# Patient Record
Sex: Male | Born: 1970 | Race: White | Hispanic: No | Marital: Single | Smoking: Never smoker
Health system: Southern US, Community
[De-identification: ages and names within clinical notes are randomized; demographics above are authoritative.]

## PROBLEM LIST (undated history)

## (undated) DIAGNOSIS — I10 Essential (primary) hypertension: Secondary | ICD-10-CM

## (undated) DIAGNOSIS — J40 Bronchitis, not specified as acute or chronic: Secondary | ICD-10-CM

## (undated) DIAGNOSIS — J189 Pneumonia, unspecified organism: Secondary | ICD-10-CM

## (undated) HISTORY — PX: HAND SURGERY: SHX662

## (undated) HISTORY — PX: HERNIA REPAIR: SHX51

---

## 2001-06-06 ENCOUNTER — Encounter: Payer: Self-pay | Admitting: *Deleted

## 2001-06-06 ENCOUNTER — Encounter: Admission: RE | Admit: 2001-06-06 | Discharge: 2001-06-06 | Payer: Self-pay | Admitting: *Deleted

## 2001-06-23 ENCOUNTER — Ambulatory Visit (HOSPITAL_BASED_OUTPATIENT_CLINIC_OR_DEPARTMENT_OTHER): Admission: RE | Admit: 2001-06-23 | Discharge: 2001-06-23 | Payer: Self-pay | Admitting: *Deleted

## 2012-07-25 ENCOUNTER — Emergency Department (HOSPITAL_COMMUNITY)
Admission: EM | Admit: 2012-07-25 | Discharge: 2012-07-25 | Disposition: A | Discharge status: Home / Self Care | Payer: Self-pay | Attending: Emergency Medicine | Admitting: Emergency Medicine

## 2012-07-25 ENCOUNTER — Emergency Department (HOSPITAL_COMMUNITY): Payer: Self-pay

## 2012-07-25 ENCOUNTER — Encounter (HOSPITAL_COMMUNITY): Payer: Self-pay | Admitting: Emergency Medicine

## 2012-07-25 DIAGNOSIS — M25519 Pain in unspecified shoulder: Secondary | ICD-10-CM | POA: Insufficient documentation

## 2012-07-25 DIAGNOSIS — S46919A Strain of unspecified muscle, fascia and tendon at shoulder and upper arm level, unspecified arm, initial encounter: Secondary | ICD-10-CM

## 2012-07-25 MED ORDER — HYDROCODONE-ACETAMINOPHEN 5-325 MG PO TABS: 1.0000 | ORAL_TABLET | ORAL | Status: AC | PRN

## 2012-07-25 MED ORDER — IBUPROFEN 800 MG PO TABS
800.0000 mg | ORAL_TABLET | Freq: Once | ORAL | Status: AC
  Administered 2012-07-25: 800 mg via ORAL
  Filled 2012-07-25: qty 1

## 2012-07-25 MED ORDER — IBUPROFEN 800 MG PO TABS: 800.0000 mg | ORAL_TABLET | Freq: Three times a day (TID) | ORAL | Status: AC

## 2012-07-25 NOTE — ED Provider Notes (Signed)
History     CSN: [id number]  Arrival date & time 07/25/12  1032   First MD Initiated Contact with Patient 07/25/12 1137      Chief Complaint  Patient presents with  . Shoulder Pain    (Consider location/radiation/quality/duration/timing/severity/associated sxs/prior treatment) Patient is a 41 y.o. male presenting with shoulder pain. The history is provided by the patient.  Shoulder Pain This is a recurrent problem. The current episode started more than 1 month ago. The problem has been unchanged. Pertinent negatives include no chills or fever. Associated symptoms comments: Recurrent left shoulder pain without known injury x 2 months, worse today.Marland Kitchen    History reviewed. No pertinent past medical history.  Past Surgical History  Procedure Date  . Hernia repair   . Hand surgery     No family history on file.  History  Substance Use Topics  . Smoking status: Never Smoker   . Smokeless tobacco: Not on file  . Alcohol Use: Yes     weekends      Review of Systems  Constitutional: Negative for fever and chills.  HENT: Negative.   Respiratory: Negative.   Cardiovascular: Negative.   Musculoskeletal: Negative.        See HPI  Skin: Negative.   Neurological: Negative.     Allergies  Review of patient's allergies indicates no known allergies.  Home Medications   Current Outpatient Rx  Name Route Sig Dispense Refill  . ASPIRIN-ACETAMINOPHEN-CAFFEINE 250-250-65 MG PO TABS Oral Take 1 tablet by mouth every 6 (six) hours as needed. For headache.      Pulse 85  Temp 98.3 F (36.8 C) (Oral)  Resp 20  SpO2 98%  Physical Exam  Constitutional: He is oriented to person, place, and time. He appears well-developed and well-nourished.  Neck: Normal range of motion.  Pulmonary/Chest: Effort normal. He exhibits no tenderness.  Musculoskeletal: Normal range of motion. He exhibits no edema.       Left shoulder tenderness over AC joint without swelling. FROM, greatest pain on  abduction. Distal pulses are 2+, 5/5 grip strength.  Neurological: He is alert and oriented to person, place, and time.  Skin: Skin is warm and dry.  Psychiatric: He has a normal mood and affect.    ED Course  Procedures (including critical care time)  Labs Reviewed - No data to display Dg Shoulder Left  07/25/2012  *RADIOLOGY REPORT*  Clinical Data: Left shoulder pain  LEFT SHOULDER - 2+ VIEW  Comparison: None.  Findings: No evidence for fracture.  No findings to suggest shoulder separation or dislocation. No worrisome lytic or sclerotic osseous lesion.  IMPRESSION: Normal exam.  Original Report Authenticated By: ERIC A. MANSELL, M.D.     No diagnosis found. 1. Muscular shoulder pain   MDM  Negative x-ray done by nursing protocol. Supports muscular diagnosis.        Rodena Medin, PA-C 07/25/12 1237

## 2012-07-25 NOTE — ED Notes (Signed)
Pt presenting to ed with c/o left shoulder pain x couple of months that getting worse. Pt states intermittent numbness at night when sleeping. Pt states it hurts to move it. Pt denies injury at this time. Pt denies chest pain, nausea and vomiting at this time

## 2012-07-25 NOTE — ED Provider Notes (Signed)
Medical screening examination/treatment/procedure(s) were performed by non-physician practitioner and as supervising physician I was immediately available for consultation/collaboration.  Flint Melter, MD 07/25/12 1622

## 2013-08-02 ENCOUNTER — Emergency Department (HOSPITAL_COMMUNITY): Payer: BC Managed Care – PPO

## 2013-08-02 ENCOUNTER — Emergency Department (HOSPITAL_COMMUNITY)
Admission: EM | Admit: 2013-08-02 | Discharge: 2013-08-02 | Disposition: A | Discharge status: Home / Self Care | Payer: BC Managed Care – PPO | Attending: Emergency Medicine | Admitting: Emergency Medicine

## 2013-08-02 ENCOUNTER — Encounter (HOSPITAL_COMMUNITY): Payer: Self-pay | Admitting: Emergency Medicine

## 2013-08-02 DIAGNOSIS — Y93H1 Activity, digging, shoveling and raking: Secondary | ICD-10-CM | POA: Insufficient documentation

## 2013-08-02 DIAGNOSIS — M79602 Pain in left arm: Secondary | ICD-10-CM

## 2013-08-02 DIAGNOSIS — X503XXA Overexertion from repetitive movements, initial encounter: Secondary | ICD-10-CM | POA: Insufficient documentation

## 2013-08-02 DIAGNOSIS — Y929 Unspecified place or not applicable: Secondary | ICD-10-CM | POA: Insufficient documentation

## 2013-08-02 DIAGNOSIS — M25529 Pain in unspecified elbow: Secondary | ICD-10-CM | POA: Insufficient documentation

## 2013-08-02 DIAGNOSIS — R079 Chest pain, unspecified: Secondary | ICD-10-CM

## 2013-08-02 DIAGNOSIS — R0789 Other chest pain: Secondary | ICD-10-CM | POA: Insufficient documentation

## 2013-08-02 LAB — BASIC METABOLIC PANEL
Calcium: 9.1 mg/dL (ref 8.4–10.5)
GFR calc Af Amer: >90 mL/min (ref >90)
GFR calc non Af Amer: >90 mL/min (ref >90)
Glucose, Bld: 152 mg/dL — ABNORMAL HIGH (ref 70–99)
Potassium: 3.8 mEq/L (ref 3.5–5.1)
Sodium: 137 mEq/L (ref 135–145)

## 2013-08-02 LAB — CBC
Hemoglobin: 17.2 g/dL — ABNORMAL HIGH (ref 13.0–17.0)
MCHC: 36.4 g/dL — ABNORMAL HIGH (ref 30.0–36.0)
Platelets: 202 10*3/uL (ref 150–400)
RDW: 12.5 % (ref 11.5–15.5)

## 2013-08-02 LAB — POCT I-STAT TROPONIN I: Troponin i, poc: 0 ng/mL (ref 0.00–0.08)

## 2013-08-02 MED ORDER — MORPHINE SULFATE 4 MG/ML IJ SOLN
4.0000 mg | Freq: Once | INTRAMUSCULAR | Status: AC
  Administered 2013-08-02: 4 mg via INTRAVENOUS
  Filled 2013-08-02 (×2): qty 1

## 2013-08-02 MED ORDER — MORPHINE SULFATE 4 MG/ML IJ SOLN
4.0000 mg | Freq: Once | INTRAMUSCULAR | Status: AC
  Administered 2013-08-02: 4 mg via INTRAVENOUS

## 2013-08-02 MED ORDER — HYDROCODONE-ACETAMINOPHEN 5-325 MG PO TABS: 1.0000 | ORAL_TABLET | ORAL | Status: DC | PRN

## 2013-08-02 NOTE — ED Notes (Signed)
Pt c/o left sided CP that radiates to left arm. Pt seen by PMD and had a cortisone shot in left elbow and told pt he would send him for a stress test if pain did not get better. Pt reports diaphoresis.

## 2013-08-02 NOTE — ED Provider Notes (Signed)
CSN: [id number]     Arrival date & time 08/02/13  1521 History     None    Chief Complaint  Patient presents with  . Chest Pain   (Consider location/radiation/quality/duration/timing/severity/associated sxs/prior Treatment) HPI 42 year old male with a significant medical history presents with left arm pain radiating to his chest. 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  Report describes as a shooting pain that begins in his hand and radiates left-sided chest and is severe. No associated shortness of breath, nausea, diaphoresis, lightheadedness, palpitations. Reports as been present for one week and use his primary care physician regarding this pain to get a steroid injection into his elbow, however he has continued to have pain. Pain is not pleuritic, not positional, and not worsened by exertion. Reports his primary care physician suggested that he continue to have pain that they would organize a stress test for him on Monday.  Denies any numbness or weakness or neck pain.  Has had a normal stress test and reports that was over 2 years ago.  Denies any history of DVT/PE, family history DVT/PE, recent surgery or immobilization, no cancer history, hemoptysis, asymmetric leg pain or swelling. Denies any prior cardiac history and denies history of smoking.   History reviewed. No pertinent past medical history. Past Surgical History  Procedure Laterality Date  . Hernia repair    . Hand surgery     No family history on file. History  Substance Use Topics  . Smoking status: Never Smoker   . Smokeless tobacco: Not on file  . Alcohol Use: Yes     Comment: weekends    Review of Systems  Constitutional: Negative for fever.  HENT: Negative for sore throat and neck stiffness.   Eyes: Negative for visual disturbance.  Respiratory:  Negative for shortness of breath (after walking .25 miles).   Cardiovascular: Positive for chest pain. Negative for leg swelling.  Gastrointestinal: Negative for nausea, vomiting and abdominal pain.  Genitourinary: Negative for difficulty urinating.  Musculoskeletal: Positive for arthralgias (arm pain). Negative for back pain.  Skin: Negative for rash.  Neurological: Negative for syncope and headaches.    Allergies  Review of patient's allergies indicates no known allergies.  Home Medications   Current Outpatient Rx  Name  Route  Sig  Dispense  Refill  . acetaminophen (TYLENOL) 325 MG tablet   Oral   Take 650 mg by mouth every 6 (six) hours as needed for pain.         Marland Kitchen ibuprofen (ADVIL,MOTRIN) 200 MG tablet   Oral   Take 400 mg by mouth every 6 (six) hours as needed for pain.         Marland Kitchen HYDROcodone-acetaminophen (NORCO/VICODIN) 5-325 MG per tablet   Oral   Take 1-2 tablets by mouth every 4 (four) hours as needed for pain.   12 tablet   0    BP 134/91  Pulse 83  Temp(Src) 97.6 F (36.4 C) (Oral)  Resp 17  Ht 5\' 5"  (1.651 m)  Wt 206 lb (93.441 kg)  BMI 34.28 kg/m2  SpO2 98% Physical Exam  Nursing note and vitals reviewed. Constitutional: He is oriented to person, place, and time. He appears well-developed and well-nourished. No distress.  HENT:  Head: Normocephalic and atraumatic.  Eyes: Conjunctivae and EOM are normal.  Neck: Normal range  of motion.  Cardiovascular: Normal rate, regular rhythm, normal heart sounds and intact distal pulses.  Exam reveals no gallop and no friction rub.   No murmur heard. Pulmonary/Chest: Effort normal and breath sounds normal. No respiratory distress. He has no wheezes. He has no rales. He exhibits no tenderness.  Abdominal: Soft. He exhibits no distension. There is no tenderness. There is no guarding.  Musculoskeletal: He exhibits no edema and no tenderness.  No neck, shoulder, arm tenderness. FROM. Normal strength and sensation  left arm  Neurological: He is alert and oriented to person, place, and time.  Skin: Skin is warm and dry. He is not diaphoretic.    ED Course   Procedures (including critical care time)  Labs Reviewed  CBC - Abnormal; Notable for the following:    Hemoglobin 17.2 (*)    MCHC 36.4 (*)    All other components within normal limits  BASIC METABOLIC PANEL - Abnormal; Notable for the following:    Glucose, Bld 152 (*)    All other components within normal limits  POCT I-STAT TROPONIN I   Dg Chest 2 View (if Patient Has Fever And/or Copd)  08/02/2013   *RADIOLOGY REPORT*  Clinical Data: Chest pain  CHEST - 2 VIEW  Comparison: December 10, 2004  Findings: Lungs clear.  Heart size and pulmonary vascularity are normal.  No adenopathy.  There is mild degenerative change in the thoracic spine. No pneumothorax.  IMPRESSION: No edema or consolidation.   Original Report Authenticated By: Bretta Bang, M.D.   Date: 08/02/2013  Rate: 74  Rhythm: normal sinus rhythm  QRS Axis: normal  Intervals: normal  ST/T Wave abnormalities: normal  Conduction Disutrbances:none  Narrative Interpretation:   Old EKG Reviewed: none available   1. Arm pain, left   2. Chest pain     MDM  42 year old male with a significant medical history presents with left arm pain radiating to his chest for 1 week.  Differential diagnosis includes aortic dissection, pneumonia, pneumothorax, ACS, PE.  EKG evaluated by me and shows no acute ischemic changes and no signs of pericarditis. Chest x-ray shows no sign of heart failure, pneumothorax, dissection, or pneumonia.  Initial troponin negative, and given patient's symptoms have been present and constant for one week had low suspicion given a negative troponin the patient has ACS.  Patient is perked negative, and a low suspicion for PE and symptoms, no tachycardia, no shortness of breath, no hypoxia, no risk factors.  Patient with normal arm strength and sensation, and no  midline neck tenderness, no history of trauma to the neck and do not suspect acute cord compression causing symptoms.   Patient given morphine for pain with relief.  Likely musculoskeletal symptoms given relation of physical work day before symptoms. Discussed results with patient and recommended close PCP followup, including the stress test that he had discussed with his PCP previously.  Patient discharged in stable condition with understanding of reasons to return.   Rhae Lerner, MD 08/03/13 1441  Rhae Lerner, MD 08/03/13 [phone]

## 2013-08-03 NOTE — ED Provider Notes (Signed)
I saw and evaluated the patient, reviewed the resident's note and I agree with the findings and plan.   Signs/Sx c/w MSK etiology of arm pain radiating to chest. Has PCP f/u tomorrow to set up outpatient stress.   [name], MD 08/03/13 [phone]

## 2014-06-21 ENCOUNTER — Encounter (HOSPITAL_BASED_OUTPATIENT_CLINIC_OR_DEPARTMENT_OTHER): Payer: Self-pay | Admitting: Emergency Medicine

## 2014-06-21 ENCOUNTER — Emergency Department (HOSPITAL_BASED_OUTPATIENT_CLINIC_OR_DEPARTMENT_OTHER)
Admission: EM | Admit: 2014-06-21 | Discharge: 2014-06-22 | Disposition: A | Discharge status: Home / Self Care | Payer: 59 | Attending: Emergency Medicine | Admitting: Emergency Medicine

## 2014-06-21 DIAGNOSIS — L089 Local infection of the skin and subcutaneous tissue, unspecified: Secondary | ICD-10-CM | POA: Insufficient documentation

## 2014-06-21 DIAGNOSIS — Z79899 Other long term (current) drug therapy: Secondary | ICD-10-CM | POA: Insufficient documentation

## 2014-06-21 NOTE — ED Notes (Signed)
Pt with paronychia to left pointer finger, swelling and redness.

## 2014-06-22 MED ORDER — HYDROCODONE-ACETAMINOPHEN 5-325 MG PO TABS: 2.0000 | ORAL_TABLET | ORAL | Status: DC | PRN

## 2014-06-22 MED ORDER — CEPHALEXIN 500 MG PO CAPS: 500.0000 mg | ORAL_CAPSULE | Freq: Four times a day (QID) | ORAL | Status: DC

## 2014-06-22 MED ORDER — SULFAMETHOXAZOLE-TRIMETHOPRIM 800-160 MG PO TABS: 1.0000 | ORAL_TABLET | Freq: Two times a day (BID) | ORAL | Status: AC

## 2014-06-22 NOTE — ED Provider Notes (Signed)
Medical screening examination/treatment/procedure(s) were performed by non-physician practitioner and as supervising physician I was immediately available for consultation/collaboration.   EKG Interpretation None       Doug SouSam Aryelle Figg, MD 06/22/14 0040

## 2014-06-22 NOTE — ED Provider Notes (Signed)
CSN: [id number]     Arrival date & time 06/21/14  2141 History   First MD Initiated Contact with Patient 06/22/14 0013     Chief Complaint  Patient presents with  . Finger Injury     (Consider location/radiation/quality/duration/timing/severity/associated sxs/prior Treatment) Patient is a 43 y.o. male presenting with hand pain. The history is provided by the patient. No language interpreter was used.  Hand Pain This is a new problem. The current episode started in the past 7 days. The problem occurs constantly. The problem has been gradually improving. Pertinent negatives include no vomiting. Nothing aggravates the symptoms. He has tried nothing for the symptoms. The treatment provided no relief.   Pt complains of swelling and pain to end of finger History reviewed. No pertinent past medical history. Past Surgical History  Procedure Laterality Date  . Hernia repair    . Hand surgery     No family history on file. History  Substance Use Topics  . Smoking status: Never Smoker   . Smokeless tobacco: Not on file  . Alcohol Use: Yes     Comment: occ    Review of Systems  Gastrointestinal: Negative for vomiting.  All other systems reviewed and are negative.     Allergies  Review of patient's allergies indicates no known allergies.  Home Medications   Prior to Admission medications   Medication Sig Start Date End Date Taking? Authorizing Provider  acetaminophen (TYLENOL) 325 MG tablet Take 650 mg by mouth every 6 (six) hours as needed for pain.    Historical Provider, MD  cephALEXin (KEFLEX) 500 MG capsule Take 1 capsule (500 mg total) by mouth 4 (four) times daily. 06/22/14   Elson AreasLeslie K Sofia, PA-C  HYDROcodone-acetaminophen (NORCO/VICODIN) 5-325 MG per tablet Take 1-2 tablets by mouth every 4 (four) hours as needed for pain. 08/02/13   Rhae LernerErin Elizabeth Schlossman, MD  HYDROcodone-acetaminophen (NORCO/VICODIN) 5-325 MG per tablet Take 2 tablets by mouth every 4 (four) hours as  needed. 06/22/14   Elson AreasLeslie K Sofia, PA-C  ibuprofen (ADVIL,MOTRIN) 200 MG tablet Take 400 mg by mouth every 6 (six) hours as needed for pain.    Historical Provider, MD  sulfamethoxazole-trimethoprim (BACTRIM DS,SEPTRA DS) 800-160 MG per tablet Take 1 tablet by mouth 2 (two) times daily. 06/22/14 06/29/14  Elson AreasLeslie K Sofia, PA-C   BP 122/83  Pulse 82  Temp(Src) 98.7 F (37.1 C) (Oral)  Resp 18  Wt 202 lb 1.6 oz (91.672 kg)  SpO2 98% Physical Exam  Nursing note and vitals reviewed. Constitutional: He appears well-developed and well-nourished.  Musculoskeletal: He exhibits tenderness.  Swelling and redness to end of finger tip.  No fluctuance  Neurological: He is alert.  Skin: Skin is warm.    ED Course  Procedures (including critical care time) Labs Review Labs Reviewed - No data to display  Imaging Review No results found.   EKG Interpretation None      MDM   Final diagnoses:  Finger infection    Bactrim hydrocodone    Elson AreasLeslie K Sofia, PA-C 06/22/14 0037

## 2014-06-22 NOTE — Discharge Instructions (Signed)
Fingertip Infection °When an infection is around the nail, it is called a paronychia. When it appears over the tip of the finger, it is called a felon. These infections are due to minor injuries or cracks in the skin. If they are not treated properly, they can lead to bone infection and permanent damage to the fingernail. °Incision and drainage is necessary if a pus pocket (an abscess) has formed. Antibiotics and pain medicine may also be needed. Keep your hand elevated for the next 2-3 days to reduce swelling and pain. If a pack was placed in the abscess, it should be removed in 1-2 days by your caregiver. Soak the finger in warm water for 20 minutes 4 times daily to help promote drainage. °Keep the hands as dry as possible. Wear protective gloves with cotton liners. See your caregiver for follow-up care as recommended.  °HOME CARE INSTRUCTIONS  °· Keep wound clean, dry and dressed as suggested by your caregiver. °· Soak in warm salt water for fifteen minutes, four times per day for bacterial infections. °· Your caregiver will prescribe an antibiotic if a bacterial infection is suspected. Take antibiotics as directed and finish the prescription, even if the problem appears to be improving before the medicine is gone. °· Only take over-the-counter or prescription medicines for pain, discomfort, or fever as directed by your caregiver. °SEEK IMMEDIATE MEDICAL CARE IF: °· There is redness, swelling, or increasing pain in the wound. °· Pus or any other unusual drainage is coming from the wound. °· An unexplained oral temperature above 102° F (38.9° C) develops. °· You notice a foul smell coming from the wound or dressing. °MAKE SURE YOU:  °· Understand these instructions. °· Monitor your condition. °· Contact your caregiver if you are getting worse or not improving. °Document Released: 01/10/2005 Document Revised: 02/25/2012 Document Reviewed: 01/06/2009 °ExitCare® Patient Information ©2015 [employer]. This  information is not intended to replace advice given to you by your health care provider. Make sure you discuss any questions you have with your health care provider. ° °

## 2014-07-25 ENCOUNTER — Emergency Department (HOSPITAL_BASED_OUTPATIENT_CLINIC_OR_DEPARTMENT_OTHER)
Admission: EM | Admit: 2014-07-25 | Discharge: 2014-07-25 | Disposition: A | Discharge status: Home / Self Care | Payer: 59 | Attending: Emergency Medicine | Admitting: Emergency Medicine

## 2014-07-25 ENCOUNTER — Emergency Department (HOSPITAL_BASED_OUTPATIENT_CLINIC_OR_DEPARTMENT_OTHER): Payer: 59

## 2014-07-25 ENCOUNTER — Encounter (HOSPITAL_BASED_OUTPATIENT_CLINIC_OR_DEPARTMENT_OTHER): Payer: Self-pay | Admitting: Emergency Medicine

## 2014-07-25 DIAGNOSIS — K5732 Diverticulitis of large intestine without perforation or abscess without bleeding: Secondary | ICD-10-CM

## 2014-07-25 DIAGNOSIS — M549 Dorsalgia, unspecified: Secondary | ICD-10-CM | POA: Diagnosis not present

## 2014-07-25 DIAGNOSIS — Z9889 Other specified postprocedural states: Secondary | ICD-10-CM | POA: Diagnosis not present

## 2014-07-25 DIAGNOSIS — Z792 Long term (current) use of antibiotics: Secondary | ICD-10-CM | POA: Diagnosis not present

## 2014-07-25 DIAGNOSIS — R1031 Right lower quadrant pain: Secondary | ICD-10-CM | POA: Diagnosis present

## 2014-07-25 LAB — URINALYSIS, ROUTINE W REFLEX MICROSCOPIC
Bilirubin Urine: NEGATIVE
GLUCOSE, UA: NEGATIVE mg/dL
HGB URINE DIPSTICK: NEGATIVE
Ketones, ur: NEGATIVE mg/dL
Leukocytes, UA: NEGATIVE
Nitrite: NEGATIVE
PH: 6 (ref 5.0–8.0)
Protein, ur: NEGATIVE mg/dL
SPECIFIC GRAVITY, URINE: 1.01 (ref 1.005–1.030)
Urobilinogen, UA: 0.2 mg/dL (ref 0.0–1.0)

## 2014-07-25 LAB — CBC WITH DIFFERENTIAL/PLATELET
Basophils Absolute: 0 10*3/uL (ref 0.0–0.1)
Basophils Relative: 0 % (ref 0–1)
EOS PCT: 2 % (ref 0–5)
Eosinophils Absolute: 0.2 10*3/uL (ref 0.0–0.7)
HEMATOCRIT: 49.4 % (ref 39.0–52.0)
HEMOGLOBIN: 17.3 g/dL — AB (ref 13.0–17.0)
LYMPHS ABS: 2.9 10*3/uL (ref 0.7–4.0)
LYMPHS PCT: 26 % (ref 12–46)
MCH: 30.8 pg (ref 26.0–34.0)
MCHC: 35 g/dL (ref 30.0–36.0)
MCV: 87.9 fL (ref 78.0–100.0)
Monocytes Absolute: 0.9 10*3/uL (ref 0.1–1.0)
Monocytes Relative: 8 % (ref 3–12)
Neutro Abs: 7 10*3/uL (ref 1.7–7.7)
Neutrophils Relative %: 64 % (ref 43–77)
PLATELETS: 189 10*3/uL (ref 150–400)
RBC: 5.62 MIL/uL (ref 4.22–5.81)
RDW: 12.4 % (ref 11.5–15.5)
WBC: 11 10*3/uL — AB (ref 4.0–10.5)

## 2014-07-25 LAB — BASIC METABOLIC PANEL
Anion gap: 14 (ref 5–15)
BUN: 8 mg/dL (ref 6–23)
CALCIUM: 9.5 mg/dL (ref 8.4–10.5)
CO2: 23 meq/L (ref 19–32)
Chloride: 103 mEq/L (ref 96–112)
Creatinine, Ser: 0.8 mg/dL (ref 0.50–1.35)
GFR calc Af Amer: >90 mL/min (ref >90)
GFR calc non Af Amer: >90 mL/min (ref >90)
GLUCOSE: 117 mg/dL — AB (ref 70–99)
Potassium: 4 mEq/L (ref 3.7–5.3)
SODIUM: 140 meq/L (ref 137–147)

## 2014-07-25 MED ORDER — CIPROFLOXACIN HCL 500 MG PO TABS: 500.0000 mg | ORAL_TABLET | Freq: Two times a day (BID) | ORAL | Status: DC

## 2014-07-25 MED ORDER — METRONIDAZOLE 500 MG PO TABS: 500.0000 mg | ORAL_TABLET | Freq: Two times a day (BID) | ORAL | Status: DC

## 2014-07-25 MED ORDER — HYDROCODONE-ACETAMINOPHEN 5-325 MG PO TABS: 1.0000 | ORAL_TABLET | ORAL | Status: DC | PRN

## 2014-07-25 MED ORDER — MORPHINE SULFATE 4 MG/ML IJ SOLN
4.0000 mg | Freq: Once | INTRAMUSCULAR | Status: AC
  Administered 2014-07-25: 4 mg via INTRAVENOUS
  Filled 2014-07-25: qty 1

## 2014-07-25 NOTE — Discharge Instructions (Signed)
Diverticulitis  Diverticulitis is inflammation or infection of small pouches in your colon that form when you have a condition called diverticulosis. The pouches in your colon are called diverticula. Your colon, or large intestine, is where water is absorbed and stool is formed.  Complications of diverticulitis can include:  · Bleeding.  · Severe infection.  · Severe pain.  · Perforation of your colon.  · Obstruction of your colon.  CAUSES   Diverticulitis is caused by bacteria.  Diverticulitis happens when stool becomes trapped in diverticula. This allows bacteria to grow in the diverticula, which can lead to inflammation and infection.  RISK FACTORS  People with diverticulosis are at risk for diverticulitis. Eating a diet that does not include enough fiber from fruits and vegetables may make diverticulitis more likely to develop.  SYMPTOMS   Symptoms of diverticulitis may include:  · Abdominal pain and tenderness. The pain is normally located on the left side of the abdomen, but may occur in other areas.  · Fever and chills.  · Bloating.  · Cramping.  · Nausea.  · Vomiting.  · Constipation.  · Diarrhea.  · Blood in your stool.  DIAGNOSIS   Your health care provider will ask you about your medical history and do a physical exam. You may need to have tests done because many medical conditions can cause the same symptoms as diverticulitis. Tests may include:  · Blood tests.  · Urine tests.  · Imaging tests of the abdomen, including X-rays and CT scans.  When your condition is under control, your health care provider may recommend that you have a colonoscopy. A colonoscopy can show how severe your diverticula are and whether something else is causing your symptoms.  TREATMENT   Most cases of diverticulitis are mild and can be treated at home. Treatment may include:  · Taking over-the-counter pain medicines.  · Following a clear liquid diet.  · Taking antibiotic medicines by mouth for 7-10 days.  More severe cases may  be treated at a hospital. Treatment may include:  · Not eating or drinking.  · Taking prescription pain medicine.  · Receiving antibiotic medicines through an IV tube.  · Receiving fluids and nutrition through an IV tube.  · Surgery.  HOME CARE INSTRUCTIONS   · Follow your health care provider's instructions carefully.  · Follow a full liquid diet or other diet as directed by your health care provider. After your symptoms improve, your health care provider may tell you to change your diet. He or she may recommend you eat a high-fiber diet. Fruits and vegetables are good sources of fiber. Fiber makes it easier to pass stool.  · Take fiber supplements or probiotics as directed by your health care provider.  · Only take medicines as directed by your health care provider.  · Keep all your follow-up appointments.  SEEK MEDICAL CARE IF:   · Your pain does not improve.  · You have a hard time eating food.  · Your bowel movements do not return to normal.  SEEK IMMEDIATE MEDICAL CARE IF:   · Your pain becomes worse.  · Your symptoms do not get better.  · Your symptoms suddenly get worse.  · You have a fever.  · You have repeated vomiting.  · You have bloody or black, tarry stools.  MAKE SURE YOU:   · Understand these instructions.  · Will watch your condition.  · Will get help right away if you are not doing well   or get worse.  Document Released: 09/12/2005 Document Revised: 12/08/2013 Document Reviewed: 10/28/2013  Exit[employer]® Patient Information ©2015 [employer]. This information is not intended to replace advice given to you by your health care provider. Make sure you discuss any questions you have with your health care provider.      Abdominal Pain  Many things can cause abdominal pain. Usually, abdominal pain is not caused by a disease and will improve without treatment. It can often be observed and treated at home. Your health care provider will do a physical exam and possibly order blood tests and X-rays to help  determine the seriousness of your pain. However, in many cases, more time must pass before a clear cause of the pain can be found. Before that point, your health care provider may not know if you need more testing or further treatment.  HOME CARE INSTRUCTIONS   Monitor your abdominal pain for any changes. The following actions may help to alleviate any discomfort you are experiencing:  · Only take over-the-counter or prescription medicines as directed by your health care provider.  · Do not take laxatives unless directed to do so by your health care provider.  · Try a clear liquid diet (broth, tea, or water) as directed by your health care provider. Slowly move to a bland diet as tolerated.  SEEK MEDICAL CARE IF:  · You have unexplained abdominal pain.  · You have abdominal pain associated with nausea or diarrhea.  · You have pain when you urinate or have a bowel movement.  · You experience abdominal pain that wakes you in the night.  · You have abdominal pain that is worsened or improved by eating food.  · You have abdominal pain that is worsened with eating fatty foods.  · You have a fever.  SEEK IMMEDIATE MEDICAL CARE IF:   · Your pain does not go away within 2 hours.  · You keep throwing up (vomiting).  · Your pain is felt only in portions of the abdomen, such as the right side or the left lower portion of the abdomen.  · You pass bloody or black tarry stools.  MAKE SURE YOU:  · Understand these instructions.    · Will watch your condition.    · Will get help right away if you are not doing well or get worse.    Document Released: 09/12/2005 Document Revised: 12/08/2013 Document Reviewed: 08/12/2013  [employer]® Patient Information ©2015 [employer]. This information is not intended to replace advice given to you by your health care provider. Make sure you discuss any questions you have with your health care provider.

## 2014-07-25 NOTE — ED Notes (Signed)
Patient c/o pain in right groin area. Started this morning. Also lower back pain

## 2014-07-25 NOTE — ED Provider Notes (Addendum)
CSN: [id number]     Arrival date & time 07/25/14  1802 History  This chart was scribed for Cody Camel, MD by Leone Payor, ED Scribe. This patient was seen in room MH11/MH11 and the patient's care was started 6:22 PM.    Chief Complaint  Patient presents with  . Abdominal Pain   The history is provided by the patient. No language interpreter was used.    HPI Comments: Cody Weiss is a 43 y.o. male who presents to the Emergency Department complaining of constant RLQ and right groin that began this morning. He describes the pain as throbbing and occasionally sharp. He rates the pain as 5-6/10 currently. He reports a history of an inguinal hernia but states this pain is not similar. He states stretching and laying supine alleviates the pain. He states sitting up or curling into fetal position aggravates the pain. He also complains of lower back pain. He denies fever, dysuria, hematuria, diarrhea, constipation, rash.   History reviewed. No pertinent past medical history. Past Surgical History  Procedure Laterality Date  . Hernia repair    . Hand surgery     No family history on file. History  Substance Use Topics  . Smoking status: Never Smoker   . Smokeless tobacco: Not on file  . Alcohol Use: Yes     Comment: occ    Review of Systems  Constitutional: Negative for fever.  Gastrointestinal: Positive for abdominal pain (RLQ). Negative for diarrhea and constipation.  Genitourinary: Negative for dysuria and hematuria.       +right groin pain  Musculoskeletal: Positive for back pain.  Skin: Negative for rash.  All other systems reviewed and are negative.     Allergies  Review of patient's allergies indicates no known allergies.  Home Medications   Prior to Admission medications   Medication Sig Start Date End Date Taking? Authorizing Provider  acetaminophen (TYLENOL) 325 MG tablet Take 650 mg by mouth every 6 (six) hours as needed for pain.    Historical Provider, MD   cephALEXin (KEFLEX) 500 MG capsule Take 1 capsule (500 mg total) by mouth 4 (four) times daily. 06/22/14   Elson Areas, PA-C  HYDROcodone-acetaminophen (NORCO/VICODIN) 5-325 MG per tablet Take 1-2 tablets by mouth every 4 (four) hours as needed for pain. 08/02/13   Rhae Lerner, MD  HYDROcodone-acetaminophen (NORCO/VICODIN) 5-325 MG per tablet Take 2 tablets by mouth every 4 (four) hours as needed. 06/22/14   Elson Areas, PA-C  ibuprofen (ADVIL,MOTRIN) 200 MG tablet Take 400 mg by mouth every 6 (six) hours as needed for pain.    Historical Provider, MD   BP 132/85  Pulse 66  Temp(Src) 97.9 F (36.6 C) (Oral)  Resp 20  Ht 5\' 5"  (1.651 m)  Wt 210 lb (95.255 kg)  BMI 34.95 kg/m2  SpO2 99% Physical Exam  Nursing note and vitals reviewed. Constitutional: He is oriented to person, place, and time. He appears well-developed and well-nourished.  HENT:  Head: Normocephalic and atraumatic.  Cardiovascular: Normal rate, regular rhythm and normal heart sounds.   Pulmonary/Chest: Effort normal and breath sounds normal. No respiratory distress. He has no wheezes. He has no rales.  Abdominal: Soft. He exhibits no distension. There is tenderness. Hernia confirmed negative in the right inguinal area and confirmed negative in the left inguinal area.  Tenderness over RLQ.  Genitourinary: Testes normal and penis normal. Right testis shows no tenderness. Left testis shows no tenderness.  No obvious inguinal hernias.  Tenderness over right groin.  No CVA tenderness  Musculoskeletal: Normal range of motion. He exhibits tenderness.  Mild left lower back tenderness.  Neurological: He is alert and oriented to person, place, and time.  Skin: Skin is warm and dry.  Psychiatric: He has a normal mood and affect.    ED Course  Procedures (including critical care time)  DIAGNOSTIC STUDIES: Oxygen Saturation is 99% on RA, normal by my interpretation.    COORDINATION OF CARE: 6:22 PM Discussed  treatment plan with pt at bedside and pt agreed to plan.   Labs Review Labs Reviewed  CBC WITH DIFFERENTIAL - Abnormal; Notable for the following:    WBC 11.0 (*)    Hemoglobin 17.3 (*)    All other components within normal limits  BASIC METABOLIC PANEL - Abnormal; Notable for the following:    Glucose, Bld 117 (*)    All other components within normal limits  URINALYSIS, ROUTINE W REFLEX MICROSCOPIC    Imaging Review Ct Abdomen Pelvis Wo Contrast  07/25/2014   CLINICAL DATA:  History of hernia repair. Right lower quadrant and right groin pain began this morning. Lower back pain.  EXAM: CT ABDOMEN AND PELVIS WITHOUT CONTRAST  TECHNIQUE: Multidetector CT imaging of the abdomen and pelvis was performed following the standard protocol without IV contrast.  COMPARISON:  None.  FINDINGS: Lower chest: Lung bases are negative.  Heart size is normal.  Upper abdomen: The kidneys have a normal appearance. No intrarenal or ureteral stones are identified. No. Renal stranding. No focal abnormality identified within the liver, spleen, pancreas, adrenal glands. The gallbladder is present.  Bowel: The stomach and small bowel loops have a normal appearance. The appendix is well seen and has a normal appearance. There are scattered colonic diverticula. With in the region of the proximal descending colon, there is a diverticulum associated with inflammatory change. Findings are consistent with acute diverticulitis in this region. There is no associated abscess or evidence for perforation.  Pelvis: The urinary bladder, prostate, and seminal vesicles have a normal appearance. No free pelvic fluid or pelvic adenopathy. No evidence for aortic aneurysm.  Abdominal wall: Unremarkable.  Osseous structures: There bilateral pars defects at L5, not associated with subluxation. No suspicious lytic or blastic lesions are identified.  IMPRESSION: 1. Changes consistent with acute diverticulitis involving the proximal descending  colon. No associated abscess or free air. 2. No intrarenal or ureteral stones. 3. Normal appendix. 4. Bilateral pars defects at L5.   Electronically Signed   By: Rosalie GumsBeth  Brown M.D.   On: 07/25/2014 18:53     EKG Interpretation None      MDM   Final diagnoses:  Right groin pain  Diverticulitis of large intestine without perforation or abscess without bleeding    No obvious source for the patient's right groin pain. I do not feel an obvious hernia. There is no significant swelling. CT scan shows no ureteral stones, appendicitis, or obvious other cause. He does have atypical left back pain that started recently, this could be having from uncomplicated diverticulitis seen on the CT. He's not having any abdominal pain on reexamination. I will treat with pain control and antibiotics. We'll recommend followup with his PCP, may need referral to a surgeon of his groin pain continues and he develops a more obvious hernia. At this time the one dose of morphine completely resolved his symptoms.  I personally performed the services described in this documentation, which was scribed in my presence. The recorded information has  been reviewed and is accurate.   Cody Camel, MD 07/25/14 1610  Cody Camel, MD 07/25/14 [phone]

## 2015-09-12 ENCOUNTER — Emergency Department (HOSPITAL_BASED_OUTPATIENT_CLINIC_OR_DEPARTMENT_OTHER)
Admission: EM | Admit: 2015-09-12 | Discharge: 2015-09-12 | Disposition: A | Discharge status: Home / Self Care | Payer: 59 | Attending: Physician Assistant | Admitting: Physician Assistant

## 2015-09-12 ENCOUNTER — Emergency Department (HOSPITAL_BASED_OUTPATIENT_CLINIC_OR_DEPARTMENT_OTHER): Payer: 59

## 2015-09-12 ENCOUNTER — Encounter (HOSPITAL_BASED_OUTPATIENT_CLINIC_OR_DEPARTMENT_OTHER): Payer: Self-pay | Admitting: Emergency Medicine

## 2015-09-12 DIAGNOSIS — J40 Bronchitis, not specified as acute or chronic: Secondary | ICD-10-CM

## 2015-09-12 DIAGNOSIS — J209 Acute bronchitis, unspecified: Secondary | ICD-10-CM | POA: Insufficient documentation

## 2015-09-12 DIAGNOSIS — R05 Cough: Secondary | ICD-10-CM | POA: Diagnosis present

## 2015-09-12 MED ORDER — AZITHROMYCIN 250 MG PO TABS
1000.0000 mg | ORAL_TABLET | Freq: Once | ORAL | Status: AC
  Administered 2015-09-12: 1000 mg via ORAL
  Filled 2015-09-12: qty 4

## 2015-09-12 MED ORDER — GUAIFENESIN-CODEINE 100-10 MG/5ML PO SOLN: 5.0000 mL | Freq: Four times a day (QID) | ORAL | Status: DC | PRN

## 2015-09-12 MED ORDER — IBUPROFEN 800 MG PO TABS
800.0000 mg | ORAL_TABLET | Freq: Once | ORAL | Status: AC
  Administered 2015-09-12: 800 mg via ORAL
  Filled 2015-09-12: qty 1

## 2015-09-12 MED ORDER — BENZONATATE 100 MG PO CAPS
100.0000 mg | ORAL_CAPSULE | Freq: Once | ORAL | Status: AC
  Administered 2015-09-12: 100 mg via ORAL
  Filled 2015-09-12: qty 1

## 2015-09-12 MED ORDER — ALBUTEROL SULFATE HFA 108 (90 BASE) MCG/ACT IN AERS
1.0000 | INHALATION_SPRAY | Freq: Once | RESPIRATORY_TRACT | Status: AC
  Administered 2015-09-12: 1 via RESPIRATORY_TRACT
  Filled 2015-09-12: qty 6.7

## 2015-09-12 MED ORDER — AZITHROMYCIN 250 MG PO TABS: 250.0000 mg | ORAL_TABLET | Freq: Once | ORAL | Status: DC

## 2015-09-12 MED ORDER — IPRATROPIUM-ALBUTEROL 0.5-2.5 (3) MG/3ML IN SOLN
3.0000 mL | Freq: Once | RESPIRATORY_TRACT | Status: AC
  Administered 2015-09-12: 3 mL via RESPIRATORY_TRACT
  Filled 2015-09-12: qty 3

## 2015-09-12 NOTE — ED Notes (Signed)
Runny nose, cough, sneezing, body aches, sore throat, headache since Friday.  Taking over the counter medications, helps some but not a lot.

## 2015-09-12 NOTE — ED Provider Notes (Signed)
CSN: [id number]   Arrival date & time 09/12/15 1703  History  By signing my name below, I, Bethel Born, attest that this documentation has been prepared under the direction and in the presence of Courteney Randall An, MD. Electronically Signed: Bethel Born, ED Scribe. 09/12/2015. 7:22 PM.  Chief Complaint  Patient presents with  . URI    HPI The history is provided by the patient. No language interpreter was used.   Cody Weiss is a 44 y.o. male who presents to the Emergency Department complaining of URI symptoms with onset 4 days ago. Associated symptoms include subjective fever, cough productive of green sputum, sore throat, headache, sneezing, rhinorrhea, and myalgias. Alka seltzer and other OTC medications provided insufficient relief in symptoms at home. The cough keeps him up at night.   No past medical history on file.  Past Surgical History  Procedure Laterality Date  . Hernia repair    . Hand surgery      No family history on file.  Social History  Substance Use Topics  . Smoking status: Never Smoker   . Smokeless tobacco: None  . Alcohol Use: Yes     Comment: occ     Review of Systems  Constitutional: Positive for fever (subjective). Negative for chills.  HENT: Positive for rhinorrhea, sneezing and sore throat.   Respiratory: Positive for cough.   Cardiovascular: Negative for chest pain.  Gastrointestinal: Negative for nausea and vomiting.  Musculoskeletal: Positive for myalgias.  Neurological: Positive for headaches. Negative for weakness.  All other systems reviewed and are negative.  Home Medications   Prior to Admission medications   Medication Sig Start Date End Date Taking? Authorizing Provider  acetaminophen (TYLENOL) 325 MG tablet Take 650 mg by mouth every 6 (six) hours as needed for pain.    Historical Provider, MD  ibuprofen (ADVIL,MOTRIN) 200 MG tablet Take 400 mg by mouth every 6 (six) hours as needed for pain.    Historical Provider, MD     Allergies  Review of patient's allergies indicates no known allergies.  Triage Vitals: BP 121/79 mmHg  Pulse 68  Temp(Src) 98.6 F (37 C) (Oral)  Resp 16  Ht  (1.651 m)  Wt 210 lb (95.255 kg)  BMI 34.95 kg/m2  SpO2 100%  Physical Exam  Constitutional: He is oriented to person, place, and time. He appears well-developed and well-nourished.  HENT:  Head: Normocephalic and atraumatic.  Mouth/Throat: No posterior oropharyngeal erythema.  Eyes: EOM are normal.  Neck: Normal range of motion.  Cardiovascular: Normal rate, regular rhythm, normal heart sounds and intact distal pulses.   Pulmonary/Chest: Effort normal and breath sounds normal. No respiratory distress.  Abdominal: Soft. He exhibits no distension. There is no tenderness.  Musculoskeletal: Normal range of motion.  Neurological: He is alert and oriented to person, place, and time.  Skin: Skin is warm and dry.  Psychiatric: He has a normal mood and affect. Judgment normal.  Nursing note and vitals reviewed.   ED Course  Procedures   DIAGNOSTIC STUDIES: Oxygen Saturation is 100% on RA, normal by my interpretation.    COORDINATION OF CARE: 7:19 PM Discussed treatment plan which includes CXR and symptomatic care with pt at bedside and pt agreed to plan.  Labs Reviewed - No data to display  Imaging Review No results found.    MDM   Final diagnoses:  None   44 year old male presenting with viral URI symptoms. Patient has cough and mild sore throat, runny nose, mild  low-grade fever. Patient's been taking Alka-Seltzer  at home. Has not been taking ibuprofen. Patient's lungs show scattered rhonchi. We'll give him 1 DuoNeb here. We'll give him ibuprofen here. Will give zpack given bronchitic changes on xray.   We'll have him take symptomatic care at home.  I personally performed the services described in this documentation, which was scribed in my presence. The recorded information has been reviewed and is  accurate.    Courteney Randall An, MD 09/12/15 2004

## 2016-03-04 ENCOUNTER — Encounter (HOSPITAL_BASED_OUTPATIENT_CLINIC_OR_DEPARTMENT_OTHER): Payer: Self-pay | Admitting: *Deleted

## 2016-03-04 ENCOUNTER — Emergency Department (HOSPITAL_BASED_OUTPATIENT_CLINIC_OR_DEPARTMENT_OTHER)
Admission: EM | Admit: 2016-03-04 | Discharge: 2016-03-04 | Disposition: A | Discharge status: Home / Self Care | Payer: 59 | Attending: Emergency Medicine | Admitting: Emergency Medicine

## 2016-03-04 DIAGNOSIS — L0390 Cellulitis, unspecified: Secondary | ICD-10-CM

## 2016-03-04 DIAGNOSIS — L0291 Cutaneous abscess, unspecified: Secondary | ICD-10-CM

## 2016-03-04 DIAGNOSIS — K122 Cellulitis and abscess of mouth: Secondary | ICD-10-CM | POA: Diagnosis not present

## 2016-03-04 DIAGNOSIS — R220 Localized swelling, mass and lump, head: Secondary | ICD-10-CM | POA: Diagnosis present

## 2016-03-04 MED ORDER — CLINDAMYCIN HCL 150 MG PO CAPS
300.0000 mg | ORAL_CAPSULE | Freq: Once | ORAL | Status: AC
  Administered 2016-03-04: 300 mg via ORAL
  Filled 2016-03-04: qty 2

## 2016-03-04 MED ORDER — BENZOCAINE 20 % MT SOLN
Freq: Once | OROMUCOSAL | Status: AC
  Administered 2016-03-04: 21:00:00 via OROMUCOSAL
  Filled 2016-03-04: qty 57

## 2016-03-04 MED ORDER — CLINDAMYCIN HCL 300 MG PO CAPS: 300.0000 mg | ORAL_CAPSULE | Freq: Three times a day (TID) | ORAL | Status: DC

## 2016-03-04 MED ORDER — OXYCODONE-ACETAMINOPHEN 5-325 MG PO TABS
1.0000 | ORAL_TABLET | Freq: Once | ORAL | Status: AC
  Administered 2016-03-04: 1 via ORAL
  Filled 2016-03-04: qty 1

## 2016-03-04 MED ORDER — OXYCODONE-ACETAMINOPHEN 5-325 MG PO TABS: 1.0000 | ORAL_TABLET | Freq: Three times a day (TID) | ORAL | Status: DC | PRN

## 2016-03-04 MED ORDER — OXYCODONE-ACETAMINOPHEN 5-325 MG PO TABS
2.0000 | ORAL_TABLET | Freq: Once | ORAL | Status: AC
  Administered 2016-03-04: 2 via ORAL
  Filled 2016-03-04: qty 2

## 2016-03-04 NOTE — ED Provider Notes (Signed)
CSN: [id number]     Arrival date & time 03/04/16  1748 History  By signing my name below, I, Budd Palmer, attest that this documentation has been prepared under the direction and in the presence of Marily Memos, MD. Electronically Signed: Budd Palmer, ED Scribe. 03/04/2016. 8:42 PM.      Chief Complaint  Patient presents with  . Oral Swelling   The history is provided by the patient. No language interpreter was used.   HPI Comments: Cody Weiss is a 45 y.o. male who presents to the Emergency Department complaining of oral swelling across the entire upper lip and up to below the left eye onset 5 days ago. Pt states this began as a boil to the left upper lip, which has since grown increasingly painful. He has taken 4 ibuprofen with mild relief.   History reviewed. No pertinent past medical history. Past Surgical History  Procedure Laterality Date  . Hernia repair    . Hand surgery     No family history on file. Social History  Substance Use Topics  . Smoking status: Never Smoker   . Smokeless tobacco: Current User    Types: Snuff  . Alcohol Use: Yes     Comment: occ    Review of Systems  Constitutional: Negative for fever.  HENT: Positive for facial swelling and mouth sores.   All other systems reviewed and are negative.   Allergies  Review of patient's allergies indicates no known allergies.  Home Medications   Prior to Admission medications   Medication Sig Start Date End Date Taking? Authorizing Provider  acetaminophen (TYLENOL) 325 MG tablet Take 650 mg by mouth every 6 (six) hours as needed for pain.    Historical Provider, MD  azithromycin (ZITHROMAX Z-PAK) 250 MG tablet Take 1 tablet (250 mg total) by mouth once. 09/12/15   [name], MD  clindamycin (CLEOCIN) 300 MG capsule Take 1 capsule (300 mg total) by mouth 3 (three) times daily. 03/05/16   Marily Memos, MD  guaiFENesin-codeine 100-10 MG/5ML syrup Take 5 mLs by mouth every 6 (six) hours as  needed for cough. 09/12/15   [name], MD  ibuprofen (ADVIL,MOTRIN) 200 MG tablet Take 400 mg by mouth every 6 (six) hours as needed for pain.    Historical Provider, MD  oxyCODONE-acetaminophen (PERCOCET/ROXICET) 5-325 MG tablet Take 1 tablet by mouth every 8 (eight) hours as needed for severe pain. 03/04/16   Soo Steelman, MD   BP 131/86 mmHg  Pulse 81  Temp(Src) 98.2 F (36.8 C) (Oral)  Resp 18  SpO2 98% Physical Exam  Constitutional: He is oriented to person, place, and time. He appears well-developed and well-nourished.  HENT:  Head: Normocephalic and atraumatic.  2 cm swollen, tender area at the left of the filtrum, not involving the nose. No trismus, no cavities  Eyes: Conjunctivae are normal. Right eye exhibits no discharge. Left eye exhibits no discharge.  Pulmonary/Chest: Effort normal. No respiratory distress.  Neurological: He is alert and oriented to person, place, and time. Coordination normal.  Skin: Skin is warm and dry. No rash noted. He is not diaphoretic. No erythema.  Psychiatric: He has a normal mood and affect.  Nursing note and vitals reviewed.   ED Course  .Marland KitchenIncision and Drainage Date/Time: 03/05/2016 12:18 AM Performed by: Marily Memos Authorized by: Marily Memos Consent: Verbal consent obtained. Risks and benefits: risks, benefits and alternatives were discussed Consent given by: patient Patient understanding: patient states understanding of the procedure  being performed Patient consent: the patient's understanding of the procedure matches consent given Required items: required blood products, implants, devices, and special equipment available Patient identity confirmed: verbally with patient and hospital-assigned identification number Time out: Immediately prior to procedure a "time out" was called to verify the correct patient, procedure, equipment, support staff and site/side marked as required. Type: abscess Body area: mouth Local  anesthetic: topical anesthetic Patient sedated: no Scalpel size: 11 Incision type: single straight Complexity: simple Drainage: purulent Drainage amount: moderate Wound treatment: wound left open Patient tolerance: Patient tolerated the procedure well with no immediate complications    DIAGNOSTIC STUDIES: Oxygen Saturation is 99% on RA, normal by my interpretation.    COORDINATION OF CARE: 8:41 PM - Discussed plans to perform an I&D and to order antibiotics. Pt advised of plan for treatment and pt agrees.  Labs Review Labs Reviewed - No data to display  Imaging Review No results found. I have personally reviewed and evaluated these images and lab results as part of my medical decision-making.   EKG Interpretation None      MDM   Final diagnoses:  Abscess and cellulitis    Oral abscess and cellulitis. I&D as above. Improved symptoms. Difficulty irrigating wound, patient will use warm compresses and will attempt to express more purulence as needed. otherwise will take antibiotics/ibuprofen/percocet.   New Prescriptions: Discharge Medication List as of 03/04/2016 11:08 PM    START taking these medications   Details  clindamycin (CLEOCIN) 300 MG capsule Take 1 capsule (300 mg total) by mouth 3 (three) times daily., Starting 03/05/2016, Until Discontinued, Print    oxyCODONE-acetaminophen (PERCOCET/ROXICET) 5-325 MG tablet Take 1 tablet by mouth every 8 (eight) hours as needed for severe pain., Starting 03/04/2016, Until Discontinued, Print         I have personally and contemperaneously reviewed labs and imaging and used in my decision making as above.   A medical screening exam was performed and I feel the patient has had an appropriate workup for their chief complaint at this time and likelihood of emergent condition existing is low. Their vital signs are stable. They have been counseled on decision, discharge, follow up and which symptoms necessitate immediate return  to the emergency department.  They verbally stated understanding and agreement with plan and discharged in stable condition.   I personally performed the services described in this documentation, which was scribed in my presence. The recorded information has been reviewed and is accurate.    Marily MemosJason Melbert Botelho, MD 03/05/16 [phone]

## 2016-03-04 NOTE — ED Notes (Signed)
Swelling to mouth under upper lip x 5 days

## 2016-10-17 DIAGNOSIS — J189 Pneumonia, unspecified organism: Secondary | ICD-10-CM

## 2016-10-17 HISTORY — DX: Pneumonia, unspecified organism: J18.9

## 2016-10-22 ENCOUNTER — Encounter (HOSPITAL_COMMUNITY): Payer: Self-pay

## 2016-10-22 ENCOUNTER — Emergency Department (HOSPITAL_COMMUNITY): Payer: 59

## 2016-10-22 ENCOUNTER — Emergency Department (HOSPITAL_COMMUNITY)
Admission: EM | Admit: 2016-10-22 | Discharge: 2016-10-22 | Disposition: A | Discharge status: Home / Self Care | Payer: 59 | Attending: Emergency Medicine | Admitting: Emergency Medicine

## 2016-10-22 DIAGNOSIS — R05 Cough: Secondary | ICD-10-CM | POA: Diagnosis present

## 2016-10-22 DIAGNOSIS — Z79899 Other long term (current) drug therapy: Secondary | ICD-10-CM | POA: Diagnosis not present

## 2016-10-22 DIAGNOSIS — L209 Atopic dermatitis, unspecified: Secondary | ICD-10-CM | POA: Insufficient documentation

## 2016-10-22 DIAGNOSIS — L259 Unspecified contact dermatitis, unspecified cause: Secondary | ICD-10-CM | POA: Insufficient documentation

## 2016-10-22 DIAGNOSIS — L309 Dermatitis, unspecified: Secondary | ICD-10-CM

## 2016-10-22 DIAGNOSIS — J209 Acute bronchitis, unspecified: Secondary | ICD-10-CM | POA: Diagnosis not present

## 2016-10-22 HISTORY — DX: Bronchitis, not specified as acute or chronic: J40

## 2016-10-22 MED ORDER — PREDNISONE 20 MG PO TABS
60.0000 mg | ORAL_TABLET | Freq: Once | ORAL | Status: AC
  Administered 2016-10-22: 60 mg via ORAL
  Filled 2016-10-22: qty 3

## 2016-10-22 MED ORDER — ALBUTEROL SULFATE HFA 108 (90 BASE) MCG/ACT IN AERS
1.0000 | INHALATION_SPRAY | RESPIRATORY_TRACT | Status: DC | PRN
  Filled 2016-10-22: qty 6.7

## 2016-10-22 MED ORDER — TRIAMCINOLONE ACETONIDE 0.1 % EX CREA: 1.0000 "application " | TOPICAL_CREAM | Freq: Two times a day (BID) | CUTANEOUS | 0 refills | Status: DC

## 2016-10-22 MED ORDER — BENZONATATE 100 MG PO CAPS: 100.0000 mg | ORAL_CAPSULE | Freq: Three times a day (TID) | ORAL | 0 refills | Status: DC

## 2016-10-22 MED ORDER — PREDNISONE 20 MG PO TABS: ORAL_TABLET | ORAL | 0 refills | Status: DC

## 2016-10-22 MED ORDER — ALBUTEROL SULFATE (2.5 MG/3ML) 0.083% IN NEBU
2.5000 mg | INHALATION_SOLUTION | Freq: Once | RESPIRATORY_TRACT | Status: AC
  Administered 2016-10-22: 2.5 mg via RESPIRATORY_TRACT
  Filled 2016-10-22: qty 3

## 2016-10-22 MED ORDER — ALBUTEROL SULFATE HFA 108 (90 BASE) MCG/ACT IN AERS: 1.0000 | INHALATION_SPRAY | Freq: Four times a day (QID) | RESPIRATORY_TRACT | 0 refills | Status: DC | PRN

## 2016-10-22 NOTE — ED Provider Notes (Addendum)
[facility]-EMERGENCY DEPT Provider Note   CSN: [id number] Arrival date & time: 10/22/16  1305     History   Chief Complaint Chief Complaint  Patient presents with  . Cough    HPI Cody Weiss is a 45 y.o. male. He presents to complaints. One he has had a cough for the last 4-5 days. He feels like he is wheezing when he coughs. He's had no fever. The cough or cysts.. Also some sinus and nasal congestion. 2. Is a rash that started on his arm and has now progressed to his back flank and bilateral arms at the elbows. His itching. No crusting. No blisters. His never had a similar rash in the past.  HPI  Past Medical History:  Diagnosis Date  . Bronchitis     There are no active problems to display for this patient.   Past Surgical History:  Procedure Laterality Date  . HAND SURGERY    . HERNIA REPAIR         Home Medications    Prior to Admission medications   Medication Sig Start Date End Date Taking? Authorizing Provider  acetaminophen (TYLENOL) 325 MG tablet Take 650 mg by mouth every 6 (six) hours as needed for pain.    Historical Provider, MD  albuterol (PROVENTIL HFA;VENTOLIN HFA) 108 (90 Base) MCG/ACT inhaler Inhale 1-2 puffs into the lungs every 6 (six) hours as needed for wheezing. 10/22/16   Rolland Porter, MD  azithromycin (ZITHROMAX Z-PAK) 250 MG tablet Take 1 tablet (250 mg total) by mouth once. 09/12/15   [name], MD  benzonatate (TESSALON) 100 MG capsule Take 1 capsule (100 mg total) by mouth every 8 (eight) hours. 10/22/16   Rolland Porter, MD  clindamycin (CLEOCIN) 300 MG capsule Take 1 capsule (300 mg total) by mouth 3 (three) times daily. 03/05/16   Marily Memos, MD  guaiFENesin-codeine 100-10 MG/5ML syrup Take 5 mLs by mouth every 6 (six) hours as needed for cough. 09/12/15   [name], MD  ibuprofen (ADVIL,MOTRIN) 200 MG tablet Take 400 mg by mouth every 6 (six) hours as needed for pain.    Historical Provider, MD  oxyCODONE-acetaminophen  (PERCOCET/ROXICET) 5-325 MG tablet Take 1 tablet by mouth every 8 (eight) hours as needed for severe pain. 03/04/16   Marily Memos, MD  predniSONE (DELTASONE) 20 MG tablet 2 by mouth daily 5 days, 1 by mouth daily 5 days. 10/22/16   Rolland Porter, MD  triamcinolone cream (KENALOG) 0.1 % Apply 1 application topically 2 (two) times daily. 10/22/16   Rolland Porter, MD    Family History History reviewed. No pertinent family history.  Social History Social History  Substance Use Topics  . Smoking status: Never Smoker  . Smokeless tobacco: Current User    Types: Snuff  . Alcohol use Yes     Comment: occ     Allergies   Patient has no known allergies.   Review of Systems Review of Systems  Constitutional: Negative for appetite change, chills, diaphoresis, fatigue and fever.  HENT: Negative for mouth sores, sore throat and trouble swallowing.   Eyes: Negative for visual disturbance.  Respiratory: Positive for shortness of breath and wheezing. Negative for cough and chest tightness.   Cardiovascular: Negative for chest pain.  Gastrointestinal: Negative for abdominal distention, abdominal pain, diarrhea, nausea and vomiting.  Endocrine: Negative for polydipsia, polyphagia and polyuria.  Genitourinary: Negative for dysuria, frequency and hematuria.  Musculoskeletal: Negative for gait problem.  Skin: Negative for color change,  pallor and rash.  Neurological: Negative for dizziness, syncope, light-headedness and headaches.  Hematological: Does not bruise/bleed easily.  Psychiatric/Behavioral: Negative for behavioral problems and confusion.     Physical Exam Updated Vital Signs BP 146/94 (BP Location: Left Arm)   Pulse 73   Temp 98.1 F (36.7 C) (Oral)   Resp 19   SpO2 98%   Physical Exam  Constitutional: He is oriented to person, place, and time. He appears well-developed and well-nourished. No distress.  HENT:  Head: Normocephalic.  Eyes: Conjunctivae are normal. Pupils are equal,  round, and reactive to light. No scleral icterus.  Neck: Normal range of motion. Neck supple. No thyromegaly present.  Cardiovascular: Normal rate and regular rhythm.  Exam reveals no gallop and no friction rub.   No murmur heard. Pulmonary/Chest: No respiratory distress. He has wheezes. He has no rales.  Abdominal: Soft. Bowel sounds are normal. He exhibits no distension. There is no tenderness. There is no rebound.  Musculoskeletal: Normal range of motion.  Neurological: He is alert and oriented to person, place, and time.  Skin: Skin is warm and dry. No rash noted.  Psychiatric: He has a normal mood and affect. His behavior is normal.     ED Treatments / Results  Labs (all labs ordered are listed, but only abnormal results are displayed) Labs Reviewed - No data to display  EKG  EKG Interpretation None       Radiology Dg Chest 2 View  Result Date: 10/22/2016 CLINICAL DATA:  45 year old male with cough for the past week. Right-sided chest pain when coughing. Bronchitis 1 year ago with similar symptoms. Nonsmoker. Initial encounter. EXAM: CHEST  2 VIEW COMPARISON:  09/12/2015. FINDINGS: Minimal peribronchial thickening may represent bronchitic/bronchitis type changes without evidence of segmental infiltrate. No pulmonary edema or pneumothorax. No plain film evidence of pulmonary malignancy. Heart size within normal limits. No acute osseous abnormality. IMPRESSION: Minimal peribronchial thickening similar to prior exam possibly representing bronchitis type changes. No segmental consolidation. Please see above. Electronically Signed   By: Lacy DuverneySteven  Olson M.D.   On: 10/22/2016 15:22    Procedures Procedures (including critical care time)  Medications Ordered in ED Medications  albuterol (PROVENTIL) (2.5 MG/3ML) 0.083% nebulizer solution 2.5 mg (2.5 mg Nebulization Given 10/22/16 1516)  predniSONE (DELTASONE) tablet 60 mg (60 mg Oral Given 10/22/16 1516)     Initial Impression /  Assessment and Plan / ED Course  I have reviewed the triage vital signs and the nursing notes.  Pertinent labs & imaging results that were available during my care of the patient were reviewed by me and considered in my medical decision making (see chart for details).  Clinical Course     Plan tapering dose of prednisone. Given first dose by mouth here. . Given albuterol neb. X-rays negative. No infiltrate or effusion. Is not febrile, tachypneic, hypoxic. No clinical indicators to suggest bacterial or separate of infection. Plan tapering dose prednisone, Tessalon, triamcinolone cream when necessary, albuterol inhaler.  Final Clinical Impressions(s) / ED Diagnoses   Final diagnoses:  Acute bronchitis, unspecified organism  Eczema, unspecified type  Atopic dermatitis, unspecified type    New Prescriptions New Prescriptions   ALBUTEROL (PROVENTIL HFA;VENTOLIN HFA) 108 (90 BASE) MCG/ACT INHALER    Inhale 1-2 puffs into the lungs every 6 (six) hours as needed for wheezing.   BENZONATATE (TESSALON) 100 MG CAPSULE    Take 1 capsule (100 mg total) by mouth every 8 (eight) hours.   PREDNISONE (DELTASONE) 20 MG TABLET  2 by mouth daily 5 days, 1 by mouth daily 5 days.   TRIAMCINOLONE CREAM (KENALOG) 0.1 %    Apply 1 application topically 2 (two) times daily.     Rolland PorterMark Hayslee Casebolt, MD 10/22/16 1550    Rolland PorterMark Aella Ronda, MD 10/22/16 [phone]

## 2016-10-22 NOTE — ED Triage Notes (Signed)
Pt reports cough and hx of bronchitis which he feels like may be back. Pt reports some soreness from coughing. Pt also has rash present bilaterally on his arms. No acute distress noted.

## 2016-10-22 NOTE — Discharge Instructions (Signed)
Inhaler as directed.  Prednisone should help your bronchitis, as well as your eczema.  Follow-up with your physician if not improving in 7-10 days

## 2016-10-22 NOTE — ED Notes (Signed)
Patient was returned to room.

## 2016-10-22 NOTE — ED Notes (Signed)
Pt taken to XRay 

## 2016-11-13 ENCOUNTER — Emergency Department (HOSPITAL_COMMUNITY): Payer: 59

## 2016-11-13 ENCOUNTER — Encounter (HOSPITAL_COMMUNITY): Payer: Self-pay | Admitting: Emergency Medicine

## 2016-11-13 ENCOUNTER — Inpatient Hospital Stay (HOSPITAL_COMMUNITY)
Admission: EM | Admit: 2016-11-13 | Discharge: 2016-11-17 | DRG: 871 | Disposition: A | Discharge status: Home / Self Care | Payer: 59 | Attending: Internal Medicine | Admitting: Internal Medicine

## 2016-11-13 DIAGNOSIS — N179 Acute kidney failure, unspecified: Secondary | ICD-10-CM | POA: Diagnosis present

## 2016-11-13 DIAGNOSIS — R945 Abnormal results of liver function studies: Secondary | ICD-10-CM

## 2016-11-13 DIAGNOSIS — F17290 Nicotine dependence, other tobacco product, uncomplicated: Secondary | ICD-10-CM | POA: Diagnosis present

## 2016-11-13 DIAGNOSIS — Z7722 Contact with and (suspected) exposure to environmental tobacco smoke (acute) (chronic): Secondary | ICD-10-CM | POA: Diagnosis present

## 2016-11-13 DIAGNOSIS — I2510 Atherosclerotic heart disease of native coronary artery without angina pectoris: Secondary | ICD-10-CM | POA: Diagnosis present

## 2016-11-13 DIAGNOSIS — A419 Sepsis, unspecified organism: Principal | ICD-10-CM | POA: Diagnosis present

## 2016-11-13 DIAGNOSIS — T380X5A Adverse effect of glucocorticoids and synthetic analogues, initial encounter: Secondary | ICD-10-CM | POA: Diagnosis present

## 2016-11-13 DIAGNOSIS — E876 Hypokalemia: Secondary | ICD-10-CM | POA: Diagnosis present

## 2016-11-13 DIAGNOSIS — R05 Cough: Secondary | ICD-10-CM

## 2016-11-13 DIAGNOSIS — Z833 Family history of diabetes mellitus: Secondary | ICD-10-CM

## 2016-11-13 DIAGNOSIS — E872 Acidosis: Secondary | ICD-10-CM | POA: Diagnosis present

## 2016-11-13 DIAGNOSIS — R059 Cough, unspecified: Secondary | ICD-10-CM

## 2016-11-13 DIAGNOSIS — R7301 Impaired fasting glucose: Secondary | ICD-10-CM | POA: Diagnosis present

## 2016-11-13 DIAGNOSIS — J189 Pneumonia, unspecified organism: Secondary | ICD-10-CM | POA: Diagnosis not present

## 2016-11-13 DIAGNOSIS — R7989 Other specified abnormal findings of blood chemistry: Secondary | ICD-10-CM

## 2016-11-13 DIAGNOSIS — I2584 Coronary atherosclerosis due to calcified coronary lesion: Secondary | ICD-10-CM | POA: Diagnosis present

## 2016-11-13 DIAGNOSIS — J209 Acute bronchitis, unspecified: Secondary | ICD-10-CM | POA: Diagnosis present

## 2016-11-13 DIAGNOSIS — Z79899 Other long term (current) drug therapy: Secondary | ICD-10-CM

## 2016-11-13 HISTORY — DX: Pneumonia, unspecified organism: J18.9

## 2016-11-13 LAB — CBC WITH DIFFERENTIAL/PLATELET
BASOS ABS: 0 10*3/uL (ref 0.0–0.1)
BASOS PCT: 1 %
EOS PCT: 3 %
Eosinophils Absolute: 0.2 10*3/uL (ref 0.0–0.7)
HEMATOCRIT: 54.2 % — AB (ref 39.0–52.0)
Hemoglobin: 19.2 g/dL — ABNORMAL HIGH (ref 13.0–17.0)
Lymphocytes Relative: 25 %
Lymphs Abs: 1.6 10*3/uL (ref 0.7–4.0)
MCH: 33.4 pg (ref 26.0–34.0)
MCHC: 35.4 g/dL (ref 30.0–36.0)
MCV: 94.4 fL (ref 78.0–100.0)
MONO ABS: 0.9 10*3/uL (ref 0.1–1.0)
Monocytes Relative: 15 %
NEUTROS ABS: 3.6 10*3/uL (ref 1.7–7.7)
Neutrophils Relative %: 56 %
PLATELETS: 142 10*3/uL — AB (ref 150–400)
RBC: 5.74 MIL/uL (ref 4.22–5.81)
RDW: 12.6 % (ref 11.5–15.5)
WBC: 6.3 10*3/uL (ref 4.0–10.5)

## 2016-11-13 LAB — URINALYSIS, ROUTINE W REFLEX MICROSCOPIC
Bilirubin Urine: NEGATIVE
Glucose, UA: NEGATIVE mg/dL
Hgb urine dipstick: NEGATIVE
Ketones, ur: NEGATIVE mg/dL
Leukocytes, UA: NEGATIVE
Nitrite: NEGATIVE
Protein, ur: NEGATIVE mg/dL
Specific Gravity, Urine: 1.006 (ref 1.005–1.030)
pH: 6.5 (ref 5.0–8.0)

## 2016-11-13 LAB — BASIC METABOLIC PANEL
ANION GAP: 11 (ref 5–15)
BUN: 10 mg/dL (ref 6–20)
CALCIUM: 8.9 mg/dL (ref 8.9–10.3)
CO2: 22 mmol/L (ref 22–32)
Chloride: 99 mmol/L — ABNORMAL LOW (ref 101–111)
Creatinine, Ser: 1.19 mg/dL (ref 0.61–1.24)
Glucose, Bld: 125 mg/dL — ABNORMAL HIGH (ref 65–99)
POTASSIUM: 3.9 mmol/L (ref 3.5–5.1)
Sodium: 132 mmol/L — ABNORMAL LOW (ref 135–145)

## 2016-11-13 LAB — I-STAT CG4 LACTIC ACID, ED
Lactic Acid, Venous: 3.15 mmol/L (ref 0.5–1.9)
Lactic Acid, Venous: 3.5 mmol/L (ref 0.5–1.9)

## 2016-11-13 LAB — RAPID STREP SCREEN (MED CTR MEBANE ONLY): Streptococcus, Group A Screen (Direct): NEGATIVE

## 2016-11-13 MED ORDER — IPRATROPIUM-ALBUTEROL 0.5-2.5 (3) MG/3ML IN SOLN
3.0000 mL | Freq: Once | RESPIRATORY_TRACT | Status: AC
  Administered 2016-11-13: 3 mL via RESPIRATORY_TRACT
  Filled 2016-11-13: qty 3

## 2016-11-13 MED ORDER — SODIUM CHLORIDE 0.9 % IV BOLUS (SEPSIS)
500.0000 mL | Freq: Once | INTRAVENOUS | Status: AC
  Administered 2016-11-13: 500 mL via INTRAVENOUS

## 2016-11-13 MED ORDER — METHYLPREDNISOLONE SODIUM SUCC 125 MG IJ SOLR
125.0000 mg | Freq: Once | INTRAMUSCULAR | Status: AC
  Administered 2016-11-13: 125 mg via INTRAVENOUS
  Filled 2016-11-13: qty 2

## 2016-11-13 MED ORDER — SODIUM CHLORIDE 0.9 % IV BOLUS (SEPSIS)
1000.0000 mL | Freq: Once | INTRAVENOUS | Status: AC
  Administered 2016-11-13: 1000 mL via INTRAVENOUS

## 2016-11-13 MED ORDER — SODIUM CHLORIDE 0.9 % IV BOLUS (SEPSIS)
1000.0000 mL | Freq: Once | INTRAVENOUS | Status: DC
  Administered 2016-11-13: 1000 mL via INTRAVENOUS

## 2016-11-13 NOTE — ED Triage Notes (Signed)
Patient reports productive cough with nasal congestion and runny nose onset this week unrelieved by OTC cough and cold medications , denies fever or chills .

## 2016-11-13 NOTE — ED Provider Notes (Signed)
[facility] DEPT Provider Note   CSN: [id number] Arrival date & time: 11/13/16  1920  By signing my name below, I, Cody Weiss, attest that this documentation has been prepared under the direction and in the presence of [employer] Corporation[employer]ole Gurman [place], PA-C.  Electronically Signed: Rosario AdieWilliam Andrew Weiss, ED Scribe. 11/13/16. 8:50 PM.  History   Chief Complaint Chief Complaint  Patient presents with  . Cough   The history is provided by the patient. No language interpreter was used.    HPI Comments: Cody LanRandy D Weiss is a 45 y.o. male with no other pertinent PMHx, who presents to the Emergency Department complaining of gradually worsening, persistent productive cough w/ yellow-green sputum onset approximately 2 days ago. He reports associated nasal congestion, subjective fever, chills, diaphoresis, polydipsia, rhinorrhea, decreased appetite, frontal headache, and diffuse myalgias/arthralgias secondary to his cough. He additionally notes sore-like bilateral ribcage pain and upper back pain secondary to coughing only. Pt was seen for same on 10/22/16 (approximately 3 weeks ago) in the ED, and at the time he was dx'd w/ Bronchitis. Pt was rx'd a taper of Prednisone, an albuterol inhaler, Tessalon pearls, and Triamcinolone cream with moderate relief of his symptoms, however, they have since worsened again. Pt is a non-smoker. No h/o COPD, asthma, or DM. Pt did not have his influenza vaccination this year. Denies ear pain, polyuria, or any other associated symptoms.   PCP: Cody PufferLITTLE,[name], MD  Past Medical History:  Diagnosis Date  . Bronchitis    There are no active problems to display for this patient.  Past Surgical History:  Procedure Laterality Date  . HAND SURGERY    . HERNIA REPAIR      Home Medications    Prior to Admission medications   Medication Sig Start Date End Date Taking? Authorizing Provider  acetaminophen (TYLENOL) 325 MG tablet Take 650 mg by mouth every 6 (six) hours as  needed for pain.   Yes Historical Provider, MD  albuterol (PROVENTIL HFA;VENTOLIN HFA) 108 (90 Base) MCG/ACT inhaler Inhale 1-2 puffs into the lungs every 6 (six) hours as needed for wheezing. 10/22/16  Yes [name], MD  benzonatate (TESSALON) 100 MG capsule Take 1 capsule (100 mg total) by mouth every 8 (eight) hours. 10/22/16  Yes [name], MD  dextromethorphan-guaiFENesin [facility](MUCINEX DM) 30-600 MG 12hr tablet Take 1 tablet by mouth 2 (two) times daily as needed for cough.   Yes Historical Provider, MD  guaiFENesin-codeine 100-10 MG/5ML syrup Take 5 mLs by mouth every 6 (six) hours as needed for cough. 09/12/15  Yes [name], MD  triamcinolone cream (KENALOG) 0.1 % Apply 1 application topically 2 (two) times daily. 10/22/16  Yes [name], MD  ibuprofen (ADVIL,MOTRIN) 200 MG tablet Take 400 mg by mouth every 6 (six) hours as needed for pain.    Historical Provider, MD   Family History No family history on file.  Social History Social History  Substance Use Topics  . Smoking status: Never Smoker  . Smokeless tobacco: Current User    Types: Snuff  . Alcohol use Yes     Comment: occ   Allergies   Patient has no known allergies.  Review of Systems Review of Systems  Respiratory: Positive for cough.     A complete 10 system review of systems was obtained and all systems are negative except as noted in the HPI and PMH.   Physical Exam Updated Vital Signs BP 161/96 (BP Location: Right Arm)   Pulse 108   Temp 98.2  F (36.8 C) (Oral)   Resp 18   Ht 5\' 5"  (1.651 m)   Wt 91.7 kg   SpO2 98%   BMI 33.65 kg/m   Physical Exam  Constitutional: He appears well-developed and well-nourished. No distress.  HENT:  Head: Normocephalic and atraumatic.  Eyes: Conjunctivae are normal.  Neck: Normal range of motion.  Cardiovascular: Tachycardia present.   Pulmonary/Chest: Effort normal. No respiratory distress. He has wheezes. He has no rales. He exhibits no tenderness.    Excellent air movement is all fields, trace scattered expiratory wheezing, no rhonchi  Abdominal: He exhibits no distension.  Musculoskeletal: Normal range of motion. He exhibits no edema. Deformity:    Neurological: He is alert.  Skin: No pallor.  Psychiatric: He has a normal mood and affect. His behavior is normal.  Nursing note and vitals reviewed.  ED Treatments / Results  DIAGNOSTIC STUDIES: Oxygen Saturation is 98% on RA, normal by my interpretation.   COORDINATION OF CARE: 8:50 PM-Discussed next steps with pt. Pt verbalized understanding and is agreeable with the plan.   Labs (all labs ordered are listed, but only abnormal results are displayed) Labs Reviewed  CBC WITH DIFFERENTIAL/PLATELET - Abnormal; Notable for the following:       Result Value   Hemoglobin 19.2 (*)    HCT 54.2 (*)    Platelets 142 (*)    All other components within normal limits  BASIC METABOLIC PANEL - Abnormal; Notable for the following:    Sodium 132 (*)    Chloride 99 (*)    Glucose, Bld 125 (*)    All other components within normal limits  I-STAT CG4 LACTIC ACID, ED - Abnormal; Notable for the following:    Lactic Acid, Venous 3.50 (*)    All other components within normal limits  CULTURE, BLOOD (ROUTINE X 2)  CULTURE, BLOOD (ROUTINE X 2)  RAPID STREP SCREEN (NOT AT [facility])  URINALYSIS, ROUTINE W REFLEX MICROSCOPIC (NOT AT [facility])    EKG  EKG Interpretation None      Radiology Dg Chest 2 View  Result Date: 11/13/2016 CLINICAL DATA:  Acute onset of cough and fever. Shortness of breath and generalized chest pain. Initial encounter. EXAM: CHEST  2 VIEW COMPARISON:  Chest radiograph performed 10/22/2016 FINDINGS: The lungs are well-aerated. Mild peribronchial thickening is noted. There is no evidence of focal opacification, pleural effusion or pneumothorax. The heart is normal in size; the mediastinal contour is within normal limits. No acute osseous abnormalities are seen. IMPRESSION:  Mild peribronchial thickening noted.  Lungs otherwise grossly clear. Electronically Signed   By: Roanna Raider M.D.   On: 11/13/2016 21:58    Procedures Procedures   Medications Ordered in ED Medications  sodium chloride 0.9 % bolus 1,000 mL (0 mLs Intravenous Stopped 11/13/16 2230)  methylPREDNISolone sodium succinate (SOLU-MEDROL) 125 mg/2 mL injection 125 mg (125 mg Intravenous Given 11/13/16 2105)  ipratropium-albuterol (DUONEB) 0.5-2.5 (3) MG/3ML nebulizer solution 3 mL (3 mLs Nebulization Given 11/13/16 2105)  sodium chloride 0.9 % bolus 1,000 mL (1,000 mLs Intravenous New Bag/Given 11/13/16 2230)  ipratropium-albuterol (DUONEB) 0.5-2.5 (3) MG/3ML nebulizer solution 3 mL (3 mLs Nebulization Given 11/13/16 2302)    Initial Impression / Assessment and Plan / ED Course  I have reviewed the triage vital signs and the nursing notes.  Pertinent labs & imaging results that were available during my care of the patient were reviewed by me and considered in my medical decision making (see chart for details).  Clinical  Course    Vitals:   11/13/16 1931  BP: 161/96  Pulse: 108  Resp: 18  Temp: 98.2 F (36.8 C)  TempSrc: Oral  SpO2: 98%  Weight: 91.7 kg  Height: 5\' 5"  (1.651 m)    Medications  sodium chloride 0.9 % bolus 1,000 mL (0 mLs Intravenous Stopped 11/13/16 2230)  methylPREDNISolone sodium succinate (SOLU-MEDROL) 125 mg/2 mL injection 125 mg (125 mg Intravenous Given 11/13/16 2105)  ipratropium-albuterol (DUONEB) 0.5-2.5 (3) MG/3ML nebulizer solution 3 mL (3 mLs Nebulization Given 11/13/16 2105)  sodium chloride 0.9 % bolus 1,000 mL (1,000 mLs Intravenous New Bag/Given 11/13/16 2230)  ipratropium-albuterol (DUONEB) 0.5-2.5 (3) MG/3ML nebulizer solution 3 mL (3 mLs Nebulization Given 11/13/16 2302)    Cody Weiss is 45 y.o. male presenting with Cough and myalgia onset in the last day, he was recently treated for bronchitis and finished a prednisone Dosepak. Sounds are  clear with scattered wheezing, he is tachycardic and patient states that he is very thirsty and feels dehydrated. Will check basic blood work and give bolus.  Lactic acid is elevated at 3.5, patient will need a higher level of care workup. Patient transferred to [place].  Final Clinical Impressions(s) / ED Diagnoses   Final diagnoses:  None   New Prescriptions New Prescriptions   No medications on file   I personally performed the services described in this documentation, which was scribed in my presence. The recorded information has been reviewed and is accurate.    Wynetta Emeryicole Roseline Ebarb, PA-C 11/13/16 2303    [name], MD 11/27/16 [phone] [phone]

## 2016-11-14 ENCOUNTER — Encounter (HOSPITAL_COMMUNITY): Payer: Self-pay | Admitting: General Practice

## 2016-11-14 DIAGNOSIS — J189 Pneumonia, unspecified organism: Secondary | ICD-10-CM | POA: Diagnosis present

## 2016-11-14 DIAGNOSIS — J209 Acute bronchitis, unspecified: Secondary | ICD-10-CM | POA: Diagnosis present

## 2016-11-14 DIAGNOSIS — R7989 Other specified abnormal findings of blood chemistry: Secondary | ICD-10-CM | POA: Diagnosis not present

## 2016-11-14 DIAGNOSIS — J204 Acute bronchitis due to parainfluenza virus: Secondary | ICD-10-CM | POA: Diagnosis not present

## 2016-11-14 DIAGNOSIS — Z833 Family history of diabetes mellitus: Secondary | ICD-10-CM | POA: Diagnosis not present

## 2016-11-14 DIAGNOSIS — N179 Acute kidney failure, unspecified: Secondary | ICD-10-CM | POA: Diagnosis present

## 2016-11-14 DIAGNOSIS — I2510 Atherosclerotic heart disease of native coronary artery without angina pectoris: Secondary | ICD-10-CM | POA: Diagnosis present

## 2016-11-14 DIAGNOSIS — Z79899 Other long term (current) drug therapy: Secondary | ICD-10-CM | POA: Diagnosis not present

## 2016-11-14 DIAGNOSIS — R7301 Impaired fasting glucose: Secondary | ICD-10-CM | POA: Diagnosis present

## 2016-11-14 DIAGNOSIS — T380X5A Adverse effect of glucocorticoids and synthetic analogues, initial encounter: Secondary | ICD-10-CM | POA: Diagnosis present

## 2016-11-14 DIAGNOSIS — E876 Hypokalemia: Secondary | ICD-10-CM | POA: Diagnosis present

## 2016-11-14 DIAGNOSIS — I2584 Coronary atherosclerosis due to calcified coronary lesion: Secondary | ICD-10-CM | POA: Diagnosis present

## 2016-11-14 DIAGNOSIS — Z7722 Contact with and (suspected) exposure to environmental tobacco smoke (acute) (chronic): Secondary | ICD-10-CM | POA: Diagnosis present

## 2016-11-14 DIAGNOSIS — F17290 Nicotine dependence, other tobacco product, uncomplicated: Secondary | ICD-10-CM | POA: Diagnosis present

## 2016-11-14 DIAGNOSIS — A419 Sepsis, unspecified organism: Secondary | ICD-10-CM | POA: Diagnosis present

## 2016-11-14 DIAGNOSIS — E872 Acidosis: Secondary | ICD-10-CM | POA: Diagnosis present

## 2016-11-14 LAB — EXPECTORATED SPUTUM ASSESSMENT W REFEX TO RESP CULTURE

## 2016-11-14 LAB — EXPECTORATED SPUTUM ASSESSMENT W GRAM STAIN, RFLX TO RESP C

## 2016-11-14 LAB — BASIC METABOLIC PANEL
Anion gap: 19 — ABNORMAL HIGH (ref 5–15)
BUN: 10 mg/dL (ref 6–20)
CO2: 14 mmol/L — AB (ref 22–32)
Calcium: 7.9 mg/dL — ABNORMAL LOW (ref 8.9–10.3)
Chloride: 103 mmol/L (ref 101–111)
Creatinine, Ser: 1.48 mg/dL — ABNORMAL HIGH (ref 0.61–1.24)
GFR calc Af Amer: >60 mL/min (ref >60)
GFR, EST NON AFRICAN AMERICAN: 56 mL/min — AB (ref >60)
GLUCOSE: 312 mg/dL — AB (ref 65–99)
POTASSIUM: 2.8 mmol/L — AB (ref 3.5–5.1)
Sodium: 136 mmol/L (ref 135–145)

## 2016-11-14 LAB — RESPIRATORY PANEL BY PCR
ADENOVIRUS-RVPPCR: NOT DETECTED
Bordetella pertussis: NOT DETECTED
CORONAVIRUS NL63-RVPPCR: NOT DETECTED
CORONAVIRUS OC43-RVPPCR: NOT DETECTED
Chlamydophila pneumoniae: NOT DETECTED
Coronavirus 229E: NOT DETECTED
Coronavirus HKU1: NOT DETECTED
INFLUENZA A-RVPPCR: NOT DETECTED
INFLUENZA B-RVPPCR: NOT DETECTED
METAPNEUMOVIRUS-RVPPCR: NOT DETECTED
Mycoplasma pneumoniae: NOT DETECTED
PARAINFLUENZA VIRUS 1-RVPPCR: DETECTED — AB
PARAINFLUENZA VIRUS 2-RVPPCR: NOT DETECTED
PARAINFLUENZA VIRUS 3-RVPPCR: NOT DETECTED
PARAINFLUENZA VIRUS 4-RVPPCR: NOT DETECTED
RESPIRATORY SYNCYTIAL VIRUS-RVPPCR: NOT DETECTED
RHINOVIRUS / ENTEROVIRUS - RVPPCR: NOT DETECTED

## 2016-11-14 LAB — HIV ANTIBODY (ROUTINE TESTING W REFLEX): HIV Screen 4th Generation wRfx: NONREACTIVE

## 2016-11-14 LAB — STREP PNEUMONIAE URINARY ANTIGEN: STREP PNEUMO URINARY ANTIGEN: NEGATIVE

## 2016-11-14 MED ORDER — GUAIFENESIN ER 600 MG PO TB12
600.0000 mg | ORAL_TABLET | Freq: Two times a day (BID) | ORAL | Status: DC
  Administered 2016-11-14 – 2016-11-16 (×6): 600 mg via ORAL
  Filled 2016-11-14 (×6): qty 1

## 2016-11-14 MED ORDER — LEVALBUTEROL HCL 1.25 MG/0.5ML IN NEBU
1.2500 mg | INHALATION_SOLUTION | Freq: Two times a day (BID) | RESPIRATORY_TRACT | Status: DC
  Administered 2016-11-15 (×2): 1.25 mg via RESPIRATORY_TRACT
  Filled 2016-11-14 (×2): qty 0.5

## 2016-11-14 MED ORDER — ENOXAPARIN SODIUM 40 MG/0.4ML ~~LOC~~ SOLN
40.0000 mg | Freq: Every day | SUBCUTANEOUS | Status: DC
  Administered 2016-11-14 – 2016-11-16 (×3): 40 mg via SUBCUTANEOUS
  Filled 2016-11-14 (×4): qty 0.4

## 2016-11-14 MED ORDER — DEXTROSE 5 % IV SOLN
1.0000 g | Freq: Every day | INTRAVENOUS | Status: DC
  Administered 2016-11-14 – 2016-11-17 (×4): 1 g via INTRAVENOUS
  Filled 2016-11-14 (×5): qty 10

## 2016-11-14 MED ORDER — ACETAMINOPHEN 325 MG PO TABS: 650.0000 mg | ORAL_TABLET | Freq: Four times a day (QID) | ORAL | Status: DC | PRN

## 2016-11-14 MED ORDER — ALBUTEROL SULFATE (2.5 MG/3ML) 0.083% IN NEBU: 2.5000 mg | INHALATION_SOLUTION | Freq: Four times a day (QID) | RESPIRATORY_TRACT | Status: DC | PRN

## 2016-11-14 MED ORDER — PREDNISONE 20 MG PO TABS
50.0000 mg | ORAL_TABLET | Freq: Every day | ORAL | Status: DC
  Administered 2016-11-14 – 2016-11-15 (×2): 50 mg via ORAL
  Filled 2016-11-14: qty 3
  Filled 2016-11-14: qty 2

## 2016-11-14 MED ORDER — ALBUTEROL (5 MG/ML) CONTINUOUS INHALATION SOLN
10.0000 mg/h | INHALATION_SOLUTION | Freq: Once | RESPIRATORY_TRACT | Status: AC
  Administered 2016-11-14: 10 mg/h via RESPIRATORY_TRACT

## 2016-11-14 MED ORDER — GUAIFENESIN-CODEINE 100-10 MG/5ML PO SOLN: 5.0000 mL | Freq: Four times a day (QID) | ORAL | Status: DC | PRN

## 2016-11-14 MED ORDER — LEVALBUTEROL HCL 1.25 MG/0.5ML IN NEBU
1.2500 mg | INHALATION_SOLUTION | Freq: Three times a day (TID) | RESPIRATORY_TRACT | Status: DC
  Administered 2016-11-14 (×3): 1.25 mg via RESPIRATORY_TRACT
  Filled 2016-11-14 (×3): qty 0.5

## 2016-11-14 MED ORDER — AZITHROMYCIN 250 MG PO TABS: 500.0000 mg | ORAL_TABLET | Freq: Every day | ORAL | Status: DC

## 2016-11-14 MED ORDER — MAGNESIUM SULFATE 2 GM/50ML IV SOLN
2.0000 g | Freq: Once | INTRAVENOUS | Status: AC
  Administered 2016-11-14: 2 g via INTRAVENOUS
  Filled 2016-11-14: qty 50

## 2016-11-14 MED ORDER — POTASSIUM CHLORIDE CRYS ER 20 MEQ PO TBCR
40.0000 meq | EXTENDED_RELEASE_TABLET | Freq: Once | ORAL | Status: AC
  Administered 2016-11-14: 40 meq via ORAL
  Filled 2016-11-14: qty 2

## 2016-11-14 MED ORDER — IBUPROFEN 400 MG PO TABS: 400.0000 mg | ORAL_TABLET | Freq: Four times a day (QID) | ORAL | Status: DC | PRN

## 2016-11-14 MED ORDER — DEXTROSE 5 % IV SOLN: 1.0000 g | Freq: Every day | INTRAVENOUS | Status: DC

## 2016-11-14 MED ORDER — SODIUM CHLORIDE 0.9 % IV SOLN
INTRAVENOUS | Status: DC
  Administered 2016-11-14 (×2): 125 mL via INTRAVENOUS
  Administered 2016-11-14: 02:00:00 via INTRAVENOUS
  Administered 2016-11-15: 125 mL via INTRAVENOUS
  Administered 2016-11-15 – 2016-11-16 (×3): via INTRAVENOUS

## 2016-11-14 MED ORDER — SODIUM CHLORIDE 0.9 % IV BOLUS (SEPSIS)
500.0000 mL | Freq: Once | INTRAVENOUS | Status: AC
  Administered 2016-11-14: 500 mL via INTRAVENOUS

## 2016-11-14 MED ORDER — BENZONATATE 100 MG PO CAPS: 200.0000 mg | ORAL_CAPSULE | Freq: Three times a day (TID) | ORAL | Status: DC | PRN

## 2016-11-14 MED ORDER — IPRATROPIUM BROMIDE 0.02 % IN SOLN
0.5000 mg | Freq: Two times a day (BID) | RESPIRATORY_TRACT | Status: DC
  Administered 2016-11-15 (×2): 0.5 mg via RESPIRATORY_TRACT
  Filled 2016-11-14 (×2): qty 2.5

## 2016-11-14 MED ORDER — AZITHROMYCIN 250 MG PO TABS: 250.0000 mg | ORAL_TABLET | Freq: Every day | ORAL | Status: DC

## 2016-11-14 MED ORDER — HYDROCODONE-ACETAMINOPHEN 5-325 MG PO TABS
1.0000 | ORAL_TABLET | Freq: Once | ORAL | Status: AC
  Administered 2016-11-14: 1 via ORAL
  Filled 2016-11-14: qty 1

## 2016-11-14 MED ORDER — AZITHROMYCIN 250 MG PO TABS
500.0000 mg | ORAL_TABLET | Freq: Every day | ORAL | Status: AC
  Administered 2016-11-14: 500 mg via ORAL
  Filled 2016-11-14: qty 2

## 2016-11-14 MED ORDER — AZITHROMYCIN 500 MG PO TABS
250.0000 mg | ORAL_TABLET | Freq: Every day | ORAL | Status: DC
  Administered 2016-11-15 – 2016-11-16 (×2): 250 mg via ORAL
  Filled 2016-11-14 (×2): qty 1

## 2016-11-14 MED ORDER — IPRATROPIUM BROMIDE 0.02 % IN SOLN
0.5000 mg | Freq: Three times a day (TID) | RESPIRATORY_TRACT | Status: DC
  Administered 2016-11-14 (×3): 0.5 mg via RESPIRATORY_TRACT
  Filled 2016-11-14 (×3): qty 2.5

## 2016-11-14 NOTE — ED Provider Notes (Signed)
Care resumed on acute care side of emergency department. Patient has diffuse inspiratory expiratory wheezes in all lung fields. Initial lactic acid 3.5. No leukocytosis. Blood cultures were collected. Chest x-ray does not show significant pneumonia but does show peribronchial thickening. Patient was given a total of 3 L of fluids. Repeat lactic acid still only 3.15. Patient has been given 2 DuoNeb abs and based on continuous nebulizer without improvement in his lung sounds. Feel that patient would benefit from inpatient admission for continued fluids and breathing treatments. We will consult hospitalist service.  Spoke with Dr. Julian ReilGardner who will consult pt in ED.   Lester KinsmanSamantha Tripp [place], PA-C 11/14/16 0057    [name], MD 11/27/16 [phone]

## 2016-11-14 NOTE — H&P (Signed)
History and Physical    Cody Cody Weiss [id number]:[id number] DOB: May 17, 1971 DOA: 11/13/2016   PCP: [name],[name], MD Chief Complaint:  Chief Complaint  Patient presents with  . Cough    HPI: Cody Weiss is a 45 y.o. male with medical history significant of previously healthy.  Patient was seen in the ED 3 weeks go on 11/6 for cough, congestion, wheezing, SOB.  He was diagnosed with acute bronchitis, put on a steroid taper, given albuterol inhaler, tessalon.  After discharge his symptoms have persisted, until he finished the prednisone taper on Monday, after which point they have severely worsened.  Due to worsening of symptoms despite treatment patient returns to the ED this evening.  ED Course: Tm 100.5 rectal, CXR shows bronchitis but no specific infiltrate for PNA.  Lactate 3.5, despite 3L IVF bolus remained 3.15 on repeat.  Continuous nebs have improved him somewhat respiratory wise but he is now tachy at 130s.  Review of Systems: As per HPI otherwise 10 point review of systems negative.    Past Medical History:  Diagnosis Date  . Bronchitis     Past Surgical History:  Procedure Laterality Date  . HAND SURGERY    . HERNIA REPAIR       reports that he has never smoked. His smokeless tobacco use includes Snuff. He reports that he drinks alcohol. He reports that he does not use drugs.  No Known Allergies  No family history on file. Diabetes does run in the family he states.  No recent respiratory illness in the family, no sick contacts.  No one else sick with similar symptoms.   Prior to Admission medications   Medication Sig Start Date End Date Taking? Authorizing Provider  acetaminophen (TYLENOL) 325 MG tablet Take 650 mg by mouth every 6 (six) hours as needed for pain.   Yes Historical Provider, MD  albuterol (PROVENTIL HFA;VENTOLIN HFA) 108 (90 Base) MCG/ACT inhaler Inhale 1-2 puffs into the lungs every 6 (six) hours as needed for wheezing. 10/22/16  Yes [name], MD    benzonatate (TESSALON) 100 MG capsule Take 1 capsule (100 mg total) by mouth every 8 (eight) hours. 10/22/16  Yes [name], MD  dextromethorphan-guaiFENesin [facility](MUCINEX DM) 30-600 MG 12hr tablet Take 1 tablet by mouth 2 (two) times daily as needed for cough.   Yes Historical Provider, MD  guaiFENesin-codeine 100-10 MG/5ML syrup Take 5 mLs by mouth every 6 (six) hours as needed for cough. 09/12/15  Yes [name], MD  triamcinolone cream (KENALOG) 0.1 % Apply 1 application topically 2 (two) times daily. 10/22/16  Yes [name], MD  ibuprofen (ADVIL,MOTRIN) 200 MG tablet Take 400 mg by mouth every 6 (six) hours as needed for pain.    Historical Provider, MD    Physical Exam: Vitals:   11/14/16 0115 11/14/16 0130 11/14/16 0200 11/14/16 0206  BP: 129/73 134/63 126/69   Pulse: (!) 138 (!) 127 (!) 138   Resp: 26 (!) 34 (!) 29   Temp:    100.5 F (38.1 C)  TempSrc:    Rectal  SpO2: 96% 93% 97%   Weight:      Height:          Constitutional: NAD, calm, comfortable Eyes: PERRL, lids and conjunctivae normal ENMT: Mucous membranes are moist. Posterior pharynx clear of any exudate or lesions.Normal dentition.  Neck: normal, supple, no masses, no thyromegaly Respiratory: Diffuse wheezing. Cardiovascular: S.Tach at 130 Abdomen: no tenderness, no masses palpated. No hepatosplenomegaly. Bowel sounds positive.  Musculoskeletal: no clubbing / cyanosis. No joint deformity upper and lower extremities. Good ROM, no contractures. Normal muscle tone.  Skin: no rashes, lesions, ulcers. No induration Neurologic: CN 2-12 grossly intact. Sensation intact, DTR normal. Strength 5/5 in all 4.  Psychiatric: Normal judgment and insight. Alert and oriented x 3. Normal mood.    Labs on Admission: I have personally reviewed following labs and imaging studies  CBC:  Recent Labs Lab 11/13/16 2057  WBC 6.3  NEUTROABS 3.6  HGB 19.2*  HCT 54.2*  MCV 94.4  PLT 142*   Basic Metabolic  Panel:  Recent Labs Lab 11/13/16 2057  NA 132*  K 3.9  CL 99*  CO2 22  GLUCOSE 125*  BUN 10  CREATININE 1.19  CALCIUM 8.9   GFR: Estimated Creatinine Clearance: 83.3 mL/min (by C-G formula based on SCr of 1.19 mg/dL). Liver Function Tests: No results for input(s): AST, ALT, ALKPHOS, BILITOT, PROT, ALBUMIN in the last 168 hours. No results for input(s): LIPASE, AMYLASE in the last 168 hours. No results for input(s): AMMONIA in the last 168 hours. Coagulation Profile: No results for input(s): INR, PROTIME in the last 168 hours. Cardiac Enzymes: No results for input(s): CKTOTAL, CKMB, CKMBINDEX, TROPONINI in the last 168 hours. BNP (last 3 results) No results for input(s): PROBNP in the last 8760 hours. HbA1C: No results for input(s): HGBA1C in the last 72 hours. CBG: No results for input(s): GLUCAP in the last 168 hours. Lipid Profile: No results for input(s): CHOL, HDL, LDLCALC, TRIG, CHOLHDL, LDLDIRECT in the last 72 hours. Thyroid Function Tests: No results for input(s): TSH, T4TOTAL, FREET4, T3FREE, THYROIDAB in the last 72 hours. Anemia Panel: No results for input(s): VITAMINB12, FOLATE, FERRITIN, TIBC, IRON, RETICCTPCT in the last 72 hours. Urine analysis:    Component Value Date/Time   COLORURINE YELLOW 11/13/2016 2300   APPEARANCEUR CLEAR 11/13/2016 2300   LABSPEC 1.006 11/13/2016 2300   PHURINE 6.5 11/13/2016 2300   GLUCOSEU NEGATIVE 11/13/2016 2300   HGBUR NEGATIVE 11/13/2016 2300   BILIRUBINUR NEGATIVE 11/13/2016 2300   KETONESUR NEGATIVE 11/13/2016 2300   PROTEINUR NEGATIVE 11/13/2016 2300   UROBILINOGEN 0.2 07/25/2014 1810   NITRITE NEGATIVE 11/13/2016 2300   LEUKOCYTESUR NEGATIVE 11/13/2016 2300   Sepsis Labs: @LABRCNTIP (procalcitonin:4,lacticidven:4) ) Recent Results (from the past 240 hour(s))  Rapid strep screen     Status: None   Collection Time: 11/13/16 11:26 PM  Result Value Ref Range Status   Streptococcus, Group A Screen (Direct)  NEGATIVE NEGATIVE Final    Comment: (NOTE) A Rapid Antigen test may result negative if the antigen level in the sample is below the detection level of this test. The FDA has not cleared this test as a stand-alone test therefore the rapid antigen negative result has reflexed to a Group A Strep culture.      Radiological Exams on Admission: Dg Chest 2 View  Result Date: 11/13/2016 CLINICAL DATA:  Acute onset of cough and fever. Shortness of breath and generalized chest pain. Initial encounter. EXAM: CHEST  2 VIEW COMPARISON:  Chest radiograph performed 10/22/2016 FINDINGS: The lungs are well-aerated. Mild peribronchial thickening is noted. There is no evidence of focal opacification, pleural effusion or pneumothorax. The heart is normal in size; the mediastinal contour is within normal limits. No acute osseous abnormalities are seen. IMPRESSION: Mild peribronchial thickening noted.  Lungs otherwise grossly clear. Electronically Signed   By: Roanna Raider M.D.   On: 11/13/2016 21:58    EKG: Independently reviewed.  Assessment/Plan Principal  Problem:   CAP (community acquired pneumonia) Active Problems:   Acute bronchitis    1. CAP / acute bronchitis - 1. Given fever, slight WBC, failure of outpatient treatment, etc, will treat patient as CAP clinically 2. Rocephin and azithromycin 3. IVF 4. Cultures pending 5. PNA pathway 6. Adult wheeze protocol 7. Prednisone 8. Tele monitor for tachycardia   DVT prophylaxis: Lovenox Code Status: Full Family Communication: Family at bedside Consults called: none Admission status: Admit to inpatient (failed outpatient treatment).   Hillary BowGARDNER, Augusto Deckman M. DO [facility] Pager [phone] from 7PM-7AM  If 7AM-7PM, please contact the day physician for the patient [email or web address] Password [email or web address]  11/14/2016, 2:10 AM

## 2016-11-14 NOTE — ED Notes (Signed)
Pt transferred to hospital bed

## 2016-11-14 NOTE — Progress Notes (Signed)
Patient seen and examined, he continue to cough and wheeze, on room air, able to talk in full sentences. He report bilateral rib pain due to constant cough. Respiratory viral pending pending, continue ivf, rocephin/zithro, oral prednisone, add nebs , mucinex. Replace k.

## 2016-11-14 NOTE — ED Notes (Signed)
Pt assisted to standing to use urinal. Pt unable to urinate. Reports may needing a catheter to empty bladder. Pt sitting on side of bed, will attempt urinal again

## 2016-11-15 ENCOUNTER — Inpatient Hospital Stay (HOSPITAL_COMMUNITY): Payer: 59

## 2016-11-15 DIAGNOSIS — R7301 Impaired fasting glucose: Secondary | ICD-10-CM

## 2016-11-15 DIAGNOSIS — R7989 Other specified abnormal findings of blood chemistry: Secondary | ICD-10-CM

## 2016-11-15 DIAGNOSIS — E872 Acidosis: Secondary | ICD-10-CM

## 2016-11-15 DIAGNOSIS — J204 Acute bronchitis due to parainfluenza virus: Secondary | ICD-10-CM

## 2016-11-15 DIAGNOSIS — N179 Acute kidney failure, unspecified: Secondary | ICD-10-CM

## 2016-11-15 DIAGNOSIS — E876 Hypokalemia: Secondary | ICD-10-CM

## 2016-11-15 LAB — COMPREHENSIVE METABOLIC PANEL
ALBUMIN: 3.1 g/dL — AB (ref 3.5–5.0)
ALK PHOS: 57 U/L (ref 38–126)
ALT: 74 U/L — ABNORMAL HIGH (ref 17–63)
ANION GAP: 8 (ref 5–15)
AST: 51 U/L — AB (ref 15–41)
BILIRUBIN TOTAL: 0.2 mg/dL — AB (ref 0.3–1.2)
BUN: 8 mg/dL (ref 6–20)
CALCIUM: 8 mg/dL — AB (ref 8.9–10.3)
CO2: 21 mmol/L — AB (ref 22–32)
Chloride: 108 mmol/L (ref 101–111)
Creatinine, Ser: 0.85 mg/dL (ref 0.61–1.24)
GFR calc Af Amer: >60 mL/min (ref >60)
GFR calc non Af Amer: >60 mL/min (ref >60)
GLUCOSE: 145 mg/dL — AB (ref 65–99)
Potassium: 3.5 mmol/L (ref 3.5–5.1)
SODIUM: 137 mmol/L (ref 135–145)
Total Protein: 5.6 g/dL — ABNORMAL LOW (ref 6.5–8.1)

## 2016-11-15 LAB — MAGNESIUM: Magnesium: 2.1 mg/dL (ref 1.7–2.4)

## 2016-11-15 MED ORDER — POTASSIUM CHLORIDE CRYS ER 20 MEQ PO TBCR
40.0000 meq | EXTENDED_RELEASE_TABLET | Freq: Once | ORAL | Status: AC
  Administered 2016-11-15: 40 meq via ORAL
  Filled 2016-11-15: qty 2

## 2016-11-15 NOTE — Progress Notes (Addendum)
PROGRESS NOTE  Cody Weiss ZOX:[id number] DOB: 10-Feb-1971 DOA: 11/13/2016 PCP: [name],[name], MD  HPI/Recap of past 24 hours:  Feeling better  Assessment/Plan: Principal Problem:   CAP (community acquired pneumonia) Active Problems:   Acute bronchitis  Sepsis presented on admission with fever, sinus tachycardia, tachypnea, lactic acidosis, cxr with bronchitis and possible early pna Better, continue iv abx, ivf, sputum culture in progress, blood culture no growth, respiratory viral panel + parainfuenza virus 1  Cough/wheezing/sob report for about 3weeks he was put on steroid taper, albuterol tessalon, however did not get better,  cxr with bronchitis/early pna, urine strep pneumon antigen negative, hiv negative, sputum culture pending Management as above  AKI: likely from sepsis /dehydration, ua unremarkable cr 1.48 on admission , Cr 0.8 on 11/30, continue ivf   Hypokalemia: replace k, mag 2.1  lft elevation: from sepsis? Report h/o moderate alcohol use hepatitis panel, liver us ordered  Impaired fasting blood sugar: check a1c   Code Status: full  Family Communication: patient   Disposition Plan: home in 24-48hrs   Consultants:  none  Procedures:  none  Antibiotics:  Rocephin/zithro   Objective: BP (!) 136/94 (BP Location: Right Arm)   Pulse 78   Temp 98.3 F (36.8 C) (Oral)   Resp (!) 24   Ht 5\' 5"  (1.651 m)   Wt 94 kg (207 lb 4.8 oz)   SpO2 97%   BMI 34.50 kg/m   Intake/Output Summary (Last 24 hours) at 11/15/16 0842 Last data filed at 11/15/16 0600  Gross per 24 hour  Intake          4300.42 ml  Output             3201 ml  Net          1099.42 ml   Filed Weights   11/13/16 1931 11/14/16 1129  Weight: 91.7 kg (202 lb 3 oz) 94 kg (207 lb 4.8 oz)    Exam:   General:  NAD  Cardiovascular: RRR  Respiratory: wheezing has much improved  Abdomen: Soft/ND/NT, positive BS  Musculoskeletal: No Edema  Neuro: aaox3  Data  Reviewed: Basic Metabolic Panel:  Recent Labs Lab 11/13/16 2057 11/14/16 0216 11/15/16 0254  NA 132* 136 137  K 3.9 2.8* 3.5  CL 99* 103 108  CO2 22 14* 21*  GLUCOSE 125* 312* 145*  BUN 10 10 8   CREATININE 1.19 1.48* 0.85  CALCIUM 8.9 7.9* 8.0*  MG  --   --  2.1   Liver Function Tests:  Recent Labs Lab 11/15/16 0254  AST 51*  ALT 74*  ALKPHOS 57  BILITOT 0.2*  PROT 5.6*  ALBUMIN 3.1*   No results for input(s): LIPASE, AMYLASE in the last 168 hours. No results for input(s): AMMONIA in the last 168 hours. CBC:  Recent Labs Lab 11/13/16 2057  WBC 6.3  NEUTROABS 3.6  HGB 19.2*  HCT 54.2*  MCV 94.4  PLT 142*   Cardiac Enzymes:   No results for input(s): CKTOTAL, CKMB, CKMBINDEX, TROPONINI in the last 168 hours. BNP (last 3 results) No results for input(s): BNP in the last 8760 hours.  ProBNP (last 3 results) No results for input(s): PROBNP in the last 8760 hours.  CBG: No results for input(s): GLUCAP in the last 168 hours.  Recent Results (from the past 240 hour(s))  Blood culture (routine x 2)     Status: None (Preliminary result)   Collection Time: 11/13/16  9:13 PM  Result Value Ref Range  Status   Specimen Description BLOOD RIGHT ARM  Final   Special Requests IN PEDIATRIC BOTTLE 4CC  Final   Culture NO GROWTH < 24 HOURS  Final   Report Status PENDING  Incomplete  Blood culture (routine x 2)     Status: None (Preliminary result)   Collection Time: 11/13/16  9:26 PM  Result Value Ref Range Status   Specimen Description BLOOD LEFT ANTECUBITAL  Final   Special Requests BOTTLES DRAWN AEROBIC AND ANAEROBIC 5CC  Final   Culture NO GROWTH < 24 HOURS  Final   Report Status PENDING  Incomplete  Rapid strep screen     Status: None   Collection Time: 11/13/16 11:26 PM  Result Value Ref Range Status   Streptococcus, Group A Screen (Direct) NEGATIVE NEGATIVE Final    Comment: (NOTE) A Rapid Antigen test may result negative if the antigen level in the sample  is below the detection level of this test. The FDA has not cleared this test as a stand-alone test therefore the rapid antigen negative result has reflexed to a Group A Strep culture.   Respiratory Panel by PCR     Status: Abnormal   Collection Time: 11/14/16  7:16 AM  Result Value Ref Range Status   Adenovirus NOT DETECTED NOT DETECTED Final   Coronavirus 229E NOT DETECTED NOT DETECTED Final   Coronavirus HKU1 NOT DETECTED NOT DETECTED Final   Coronavirus NL63 NOT DETECTED NOT DETECTED Final   Coronavirus OC43 NOT DETECTED NOT DETECTED Final   Metapneumovirus NOT DETECTED NOT DETECTED Final   Rhinovirus / Enterovirus NOT DETECTED NOT DETECTED Final   Influenza A NOT DETECTED NOT DETECTED Final   Influenza B NOT DETECTED NOT DETECTED Final   Parainfluenza Virus 1 DETECTED (A) NOT DETECTED Final   Parainfluenza Virus 2 NOT DETECTED NOT DETECTED Final   Parainfluenza Virus 3 NOT DETECTED NOT DETECTED Final   Parainfluenza Virus 4 NOT DETECTED NOT DETECTED Final   Respiratory Syncytial Virus NOT DETECTED NOT DETECTED Final   Bordetella pertussis NOT DETECTED NOT DETECTED Final   Chlamydophila pneumoniae NOT DETECTED NOT DETECTED Final   Mycoplasma pneumoniae NOT DETECTED NOT DETECTED Final  Culture, sputum-assessment     Status: None   Collection Time: 11/14/16 10:59 AM  Result Value Ref Range Status   Specimen Description EXPECTORATED SPUTUM  Final   Special Requests NONE  Final   Sputum evaluation   Final    THIS SPECIMEN IS ACCEPTABLE. RESPIRATORY CULTURE REPORT TO FOLLOW.   Report Status 11/14/2016 FINAL  Final     Studies: No results found.  Scheduled Meds: . azithromycin  250 mg Oral Daily  . cefTRIAXone (ROCEPHIN)  IV  1 g Intravenous Q0600  . enoxaparin (LOVENOX) injection  40 mg Subcutaneous Daily  . guaiFENesin  600 mg Oral BID  . ipratropium  0.5 mg Nebulization BID  . levalbuterol  1.25 mg Nebulization BID  . potassium chloride  40 mEq Oral Once  . predniSONE  50  mg Oral Q breakfast    Continuous Infusions: . sodium chloride 125 mL/hr at 11/15/16 [id number]     Time spent: 35 mins  Lorenzo Pereyra MD, PhD  [facility] Pager [phone]. If 7PM-7AM, please contact night-coverage at [email or web address], password [email or web address] 11/15/2016, 8:42 AM  LOS: 1 day

## 2016-11-15 NOTE — Progress Notes (Signed)
Pt stated he will take care of bath. Tech gave Pt all bath supplies.

## 2016-11-16 ENCOUNTER — Inpatient Hospital Stay (HOSPITAL_COMMUNITY): Payer: 59

## 2016-11-16 LAB — COMPREHENSIVE METABOLIC PANEL
ALBUMIN: 3.2 g/dL — AB (ref 3.5–5.0)
ALT: 80 U/L — AB (ref 17–63)
AST: 45 U/L — AB (ref 15–41)
Alkaline Phosphatase: 56 U/L (ref 38–126)
Anion gap: 10 (ref 5–15)
BUN: 11 mg/dL (ref 6–20)
CO2: 24 mmol/L (ref 22–32)
CREATININE: 0.83 mg/dL (ref 0.61–1.24)
Calcium: 8.7 mg/dL — ABNORMAL LOW (ref 8.9–10.3)
Chloride: 103 mmol/L (ref 101–111)
GFR calc Af Amer: >60 mL/min (ref >60)
GFR calc non Af Amer: >60 mL/min (ref >60)
GLUCOSE: 128 mg/dL — AB (ref 65–99)
POTASSIUM: 3.8 mmol/L (ref 3.5–5.1)
Sodium: 137 mmol/L (ref 135–145)
TOTAL PROTEIN: 6.2 g/dL — AB (ref 6.5–8.1)
Total Bilirubin: 0.4 mg/dL (ref 0.3–1.2)

## 2016-11-16 LAB — CULTURE, RESPIRATORY

## 2016-11-16 LAB — CULTURE, RESPIRATORY W GRAM STAIN: Culture: NORMAL

## 2016-11-16 LAB — CBC
HEMATOCRIT: 48 % (ref 39.0–52.0)
HEMOGLOBIN: 16.6 g/dL (ref 13.0–17.0)
MCH: 32.8 pg (ref 26.0–34.0)
MCHC: 34.6 g/dL (ref 30.0–36.0)
MCV: 94.9 fL (ref 78.0–100.0)
Platelets: 148 10*3/uL — ABNORMAL LOW (ref 150–400)
RBC: 5.06 MIL/uL (ref 4.22–5.81)
RDW: 12.6 % (ref 11.5–15.5)
WBC: 10 10*3/uL (ref 4.0–10.5)

## 2016-11-16 LAB — CULTURE, GROUP A STREP (THRC)

## 2016-11-16 LAB — HEMOGLOBIN A1C
HEMOGLOBIN A1C: 5.4 % (ref 4.8–5.6)
Mean Plasma Glucose: 108 mg/dL

## 2016-11-16 LAB — LACTIC ACID, PLASMA: LACTIC ACID, VENOUS: 1.9 mmol/L (ref 0.5–1.9)

## 2016-11-16 LAB — MRSA PCR SCREENING: MRSA by PCR: NEGATIVE

## 2016-11-16 MED ORDER — LEVALBUTEROL HCL 1.25 MG/0.5ML IN NEBU
1.2500 mg | INHALATION_SOLUTION | Freq: Four times a day (QID) | RESPIRATORY_TRACT | Status: DC
  Administered 2016-11-16 – 2016-11-17 (×3): 1.25 mg via RESPIRATORY_TRACT
  Filled 2016-11-16 (×3): qty 0.5

## 2016-11-16 MED ORDER — METHYLPREDNISOLONE SODIUM SUCC 125 MG IJ SOLR
60.0000 mg | Freq: Three times a day (TID) | INTRAMUSCULAR | Status: DC
  Administered 2016-11-16 – 2016-11-17 (×4): 60 mg via INTRAVENOUS
  Filled 2016-11-16 (×4): qty 2

## 2016-11-16 MED ORDER — IPRATROPIUM BROMIDE 0.02 % IN SOLN
0.5000 mg | Freq: Four times a day (QID) | RESPIRATORY_TRACT | Status: DC
  Administered 2016-11-16 – 2016-11-17 (×4): 0.5 mg via RESPIRATORY_TRACT
  Filled 2016-11-16 (×4): qty 2.5

## 2016-11-16 MED ORDER — LEVALBUTEROL HCL 1.25 MG/0.5ML IN NEBU
1.2500 mg | INHALATION_SOLUTION | Freq: Four times a day (QID) | RESPIRATORY_TRACT | Status: DC
  Administered 2016-11-16: 1.25 mg via RESPIRATORY_TRACT
  Filled 2016-11-16: qty 0.5

## 2016-11-16 NOTE — Progress Notes (Signed)
[id number]  Pt given explanation for transfer to a different unit and is agreeable to such.  All  belongings with pt. Will transport via WC.

## 2016-11-16 NOTE — Progress Notes (Signed)
PROGRESS NOTE  Cody Weiss ZOX:[id number] DOB: 28-Aug-1971 DOA: 11/13/2016 PCP: [name],[name], MD  HPI/Recap of past 24 hours:  Feeling better, but still significant cough and wheezing  Assessment/Plan: Principal Problem:   CAP (community acquired pneumonia) Active Problems:   Acute bronchitis  Sepsis presented on admission with fever, sinus tachycardia, tachypnea, lactic acidosis, cxr with bronchitis and possible early pna Better, continue iv abx, d/c ivf, sputum culture in progress, blood culture no growth, respiratory viral panel + parainfuenza virus 1  Cough/wheezing/sob report for about 3weeks he was put on steroid taper, albuterol tessalon, however did not get better,  cxr with bronchitis/early pna, urine strep pneumon antigen negative, hiv negative, sputum culture pending Persistent diffuse wheezing, will increase steroids and neb dose /frequency, check Ct chest  AKI: likely from sepsis /dehydration, ua unremarkable cr 1.48 on admission , Cr 0.8 normalized with  ivf   Hypokalemia: replace k, mag 2.1  lft elevation: from sepsis? Report h/o moderate alcohol use hepatitis panel in process, liver us no acute findings  Impaired fasting blood sugar: check a1c   Code Status: full  Family Communication: patient   Disposition Plan: home once cough and wheezing improved, hopefully on 12/2   Consultants:  none  Procedures:  none  Antibiotics:  Rocephin/zithro   Objective: BP 129/81 (BP Location: Right Arm)   Pulse 66   Temp 98.1 F (36.7 C) (Oral)   Resp 20   Ht 5\' 5"  (1.651 m)   Wt 94 kg (207 lb 4.8 oz)   SpO2 97%   BMI 34.50 kg/m   Intake/Output Summary (Last 24 hours) at 11/16/16 0823 Last data filed at 11/16/16 0641  Gross per 24 hour  Intake          3294.17 ml  Output             3600 ml  Net          -305.83 ml   Filed Weights   11/13/16 1931 11/14/16 1129  Weight: 91.7 kg (202 lb 3 oz) 94 kg (207 lb 4.8 oz)    Exam:   General:   NAD  Cardiovascular: RRR  Respiratory: persistent diffuse bilateral wheezing, + rhonchi  Abdomen: Soft/ND/NT, positive BS  Musculoskeletal: No Edema  Neuro: aaox3  Data Reviewed: Basic Metabolic Panel:  Recent Labs Lab 11/13/16 2057 11/14/16 0216 11/15/16 0254 11/16/16 0312  NA 132* 136 137 137  K 3.9 2.8* 3.5 3.8  CL 99* 103 108 103  CO2 22 14* 21* 24  GLUCOSE 125* 312* 145* 128*  BUN 10 10 8 11   CREATININE 1.19 1.48* 0.85 0.83  CALCIUM 8.9 7.9* 8.0* 8.7*  MG  --   --  2.1  --    Liver Function Tests:  Recent Labs Lab 11/15/16 0254 11/16/16 0312  AST 51* 45*  ALT 74* 80*  ALKPHOS 57 56  BILITOT 0.2* 0.4  PROT 5.6* 6.2*  ALBUMIN 3.1* 3.2*   No results for input(s): LIPASE, AMYLASE in the last 168 hours. No results for input(s): AMMONIA in the last 168 hours. CBC:  Recent Labs Lab 11/13/16 2057 11/16/16 0312  WBC 6.3 10.0  NEUTROABS 3.6  --   HGB 19.2* 16.6  HCT 54.2* 48.0  MCV 94.4 94.9  PLT 142* 148*   Cardiac Enzymes:   No results for input(s): CKTOTAL, CKMB, CKMBINDEX, TROPONINI in the last 168 hours. BNP (last 3 results) No results for input(s): BNP in the last 8760 hours.  ProBNP (last 3  results) No results for input(s): PROBNP in the last 8760 hours.  CBG: No results for input(s): GLUCAP in the last 168 hours.  Recent Results (from the past 240 hour(s))  Blood culture (routine x 2)     Status: None (Preliminary result)   Collection Time: 11/13/16  9:13 PM  Result Value Ref Range Status   Specimen Description BLOOD RIGHT ARM  Final   Special Requests IN PEDIATRIC BOTTLE 4CC  Final   Culture NO GROWTH 2 DAYS  Final   Report Status PENDING  Incomplete  Blood culture (routine x 2)     Status: None (Preliminary result)   Collection Time: 11/13/16  9:26 PM  Result Value Ref Range Status   Specimen Description BLOOD LEFT ANTECUBITAL  Final   Special Requests BOTTLES DRAWN AEROBIC AND ANAEROBIC 5CC  Final   Culture NO GROWTH 2 DAYS   Final   Report Status PENDING  Incomplete  Rapid strep screen     Status: None   Collection Time: 11/13/16 11:26 PM  Result Value Ref Range Status   Streptococcus, Group A Screen (Direct) NEGATIVE NEGATIVE Final    Comment: (NOTE) A Rapid Antigen test may result negative if the antigen level in the sample is below the detection level of this test. The FDA has not cleared this test as a stand-alone test therefore the rapid antigen negative result has reflexed to a Group A Strep culture.   Culture, group A strep     Status: None (Preliminary result)   Collection Time: 11/13/16 11:26 PM  Result Value Ref Range Status   Specimen Description THROAT  Final   Special Requests NONE Reflexed from [id number]  Final   Culture CULTURE REINCUBATED FOR BETTER GROWTH  Final   Report Status PENDING  Incomplete  Respiratory Panel by PCR     Status: Abnormal   Collection Time: 11/14/16  7:16 AM  Result Value Ref Range Status   Adenovirus NOT DETECTED NOT DETECTED Final   Coronavirus 229E NOT DETECTED NOT DETECTED Final   Coronavirus HKU1 NOT DETECTED NOT DETECTED Final   Coronavirus NL63 NOT DETECTED NOT DETECTED Final   Coronavirus OC43 NOT DETECTED NOT DETECTED Final   Metapneumovirus NOT DETECTED NOT DETECTED Final   Rhinovirus / Enterovirus NOT DETECTED NOT DETECTED Final   Influenza A NOT DETECTED NOT DETECTED Final   Influenza B NOT DETECTED NOT DETECTED Final   Parainfluenza Virus 1 DETECTED (A) NOT DETECTED Final   Parainfluenza Virus 2 NOT DETECTED NOT DETECTED Final   Parainfluenza Virus 3 NOT DETECTED NOT DETECTED Final   Parainfluenza Virus 4 NOT DETECTED NOT DETECTED Final   Respiratory Syncytial Virus NOT DETECTED NOT DETECTED Final   Bordetella pertussis NOT DETECTED NOT DETECTED Final   Chlamydophila pneumoniae NOT DETECTED NOT DETECTED Final   Mycoplasma pneumoniae NOT DETECTED NOT DETECTED Final  Culture, sputum-assessment     Status: None   Collection Time: 11/14/16 10:59 AM   Result Value Ref Range Status   Specimen Description EXPECTORATED SPUTUM  Final   Special Requests NONE  Final   Sputum evaluation   Final    THIS SPECIMEN IS ACCEPTABLE. RESPIRATORY CULTURE REPORT TO FOLLOW.   Report Status 11/14/2016 FINAL  Final  Culture, respiratory (NON-Expectorated)     Status: None (Preliminary result)   Collection Time: 11/14/16 10:59 AM  Result Value Ref Range Status   Specimen Description SPUTUM  Final   Special Requests NONE  Final   Gram Stain   Final  MODERATE WBC PRESENT,BOTH PMN AND MONONUCLEAR MODERATE GRAM POSITIVE COCCI IN PAIRS IN CLUSTERS FEW GRAM POSITIVE RODS FEW GRAM NEGATIVE RODS FEW SQUAMOUS EPITHELIAL CELLS PRESENT    Culture Consistent with normal respiratory flora.  Final   Report Status PENDING  Incomplete     Studies: US Abdomen Limited Ruq  Result Date: 11/15/2016 CLINICAL DATA:  Elevated LFTs EXAM: US ABDOMEN LIMITED - RIGHT UPPER QUADRANT COMPARISON:  CT 07/25/2014 FINDINGS: Gallbladder: No gallstones or wall thickening visualized. No sonographic Murphy sign noted by sonographer. Common bile duct: Diameter: 3.8 mm Liver: No focal lesion identified. Within normal limits in parenchymal echogenicity. IMPRESSION: Negative right upper quadrant abdominal ultrasound Electronically Signed   By: Jasmine Pang M.D.   On: 11/15/2016 18:38    Scheduled Meds: . azithromycin  250 mg Oral Daily  . cefTRIAXone (ROCEPHIN)  IV  1 g Intravenous Q0600  . enoxaparin (LOVENOX) injection  40 mg Subcutaneous Daily  . guaiFENesin  600 mg Oral BID  . ipratropium  0.5 mg Nebulization Q6H  . levalbuterol  1.25 mg Nebulization Q6H  . methylPREDNISolone (SOLU-MEDROL) injection  60 mg Intravenous Q8H    Continuous Infusions: . sodium chloride 125 mL/hr at 11/16/16 0641     Time spent: 25 mins  Demitrius Crass MD, PhD  [facility] Pager [phone]. If 7PM-7AM, please contact night-coverage at [email or web address], password [facility] 11/16/2016, 8:23 AM  LOS: 2  days

## 2016-11-17 LAB — COMPREHENSIVE METABOLIC PANEL
ALBUMIN: 3.5 g/dL (ref 3.5–5.0)
ALT: 81 U/L — ABNORMAL HIGH (ref 17–63)
ANION GAP: 11 (ref 5–15)
AST: 33 U/L (ref 15–41)
Alkaline Phosphatase: 59 U/L (ref 38–126)
BILIRUBIN TOTAL: 0.6 mg/dL (ref 0.3–1.2)
BUN: 12 mg/dL (ref 6–20)
CO2: 24 mmol/L (ref 22–32)
Calcium: 9.5 mg/dL (ref 8.9–10.3)
Chloride: 101 mmol/L (ref 101–111)
Creatinine, Ser: 0.89 mg/dL (ref 0.61–1.24)
GFR calc Af Amer: >60 mL/min (ref >60)
GFR calc non Af Amer: >60 mL/min (ref >60)
GLUCOSE: 155 mg/dL — AB (ref 65–99)
POTASSIUM: 4.7 mmol/L (ref 3.5–5.1)
SODIUM: 136 mmol/L (ref 135–145)
TOTAL PROTEIN: 6.7 g/dL (ref 6.5–8.1)

## 2016-11-17 LAB — MAGNESIUM: Magnesium: 2.4 mg/dL (ref 1.7–2.4)

## 2016-11-17 LAB — HEPATITIS PANEL, ACUTE
HCV Ab: <0.1 s/co ratio (ref 0.0–0.9)
HEP B C IGM: NEGATIVE
Hep A IgM: NEGATIVE
Hepatitis B Surface Ag: NEGATIVE

## 2016-11-17 LAB — CK: Total CK: 100 U/L (ref 49–397)

## 2016-11-17 MED ORDER — PREDNISONE 10 MG PO TABS: ORAL_TABLET | ORAL | 0 refills | Status: DC

## 2016-11-17 MED ORDER — DOXYCYCLINE HYCLATE 100 MG PO CAPS: 100.0000 mg | ORAL_CAPSULE | Freq: Two times a day (BID) | ORAL | 0 refills | Status: DC

## 2016-11-17 MED ORDER — DOXYCYCLINE HYCLATE 100 MG PO CAPS: 100.0000 mg | ORAL_CAPSULE | Freq: Two times a day (BID) | ORAL | 0 refills | Status: AC

## 2016-11-17 MED ORDER — GUAIFENESIN ER 600 MG PO TB12
600.0000 mg | ORAL_TABLET | Freq: Two times a day (BID) | ORAL | 0 refills | Status: DC
Start: 2016-11-17 — End: 2018-11-18

## 2016-11-17 NOTE — Discharge Summary (Signed)
Discharge Summary  Cody Weiss PFX:[id number] DOB: [birth year]  PCP: Tamsen Roers, MD  Admit date: 11/13/2016 Discharge date: 11/17/2016  Time spent: >25mns, more than 50% time spent on coordination of care  Recommendations for Outpatient Follow-up:  1. F/u with PMD within a week  for hospital discharge follow up, repeat cbc/cmp at follow up. pmd to refer patient to pulmonologist 2. Work not provided at discharge.  Discharge Diagnoses:  Active Hospital Problems   Diagnosis Date Noted  . CAP (community acquired pneumonia) 11/14/2016  . Acute bronchitis 11/14/2016    Resolved Hospital Problems   Diagnosis Date Noted Date Resolved  No resolved problems to display.    Discharge Condition: stable  Diet recommendation: heart healthy  Filed Weights   11/13/16 1931 11/14/16 1129 11/16/16 2102  Weight: 91.7 kg (202 lb 3 oz) 94 kg (207 lb 4.8 oz) 92.1 kg (203 lb)    History of present illness:  HPI: Cody RUTIGLIANOis a 45y.o. male with medical history significant of previously healthy.  Patient was seen in the ED 3 weeks go on 11/6 for cough, congestion, wheezing, SOB.  He was diagnosed with acute bronchitis, put on a steroid taper, given albuterol inhaler, tessalon.  After discharge his symptoms have persisted, until he finished the prednisone taper on Monday, after which point they have severely worsened.  Due to worsening of symptoms despite treatment patient returns to the ED this evening.  ED Course: Tm 100.5 rectal, CXR shows bronchitis but no specific infiltrate for PNA.  Lactate 3.5, despite 3L IVF bolus remained 3.15 on repeat.  Continuous nebs have improved him somewhat respiratory wise but he is now tachy at 130s.   Hospital Course:  Principal Problem:   CAP (community acquired pneumonia) Active Problems:   Acute bronchitis   Sepsis presented on admission with fever, sinus tachycardia, tachypnea, lactic acidosis, cxr with bronchitis and possible early pna He  received iv abx with rocephin/zitrho,  ivf,  Sepsis Resolved.  Cough/wheezing/sob  report for about 3weeks he was put on steroid taper, albuterol tessalon, however did not get better,  cxr on presentation (11/28) with bronchitis/early pna, respiratory viral panel + parainfuenza virus 1 urine strep pneumon antigen negative, hiv negative, sputum culture with normal respiratory flora.  blood culture no growth.  Due to Persistent diffuse wheezing/cough, steroid dose increased, Ct chest was done on 12/1no significant lung abnormalities,  Symptom has finally improved, he is discharged home on longer steroids taper and doxycycline   His slow recovery is unexpected in this otherwise healthy individual, On further questioning, patient has a long history of second hand smoking exposure and he has a long history of bronchitis twice a year with weather changes, he is advised to be evaluated by a pulmonologist on outpatient basis.  AKI:  likely from sepsis /dehydration, ua unremarkable cr 1.48 on admission , Cr 0.8 normalized with  ivf   Hypokalemia:  k 2.8 on presentation  k replaced, mag 2.1  lft elevation:  ast 51/alt 74, with normal alk phos and normal tbili, ck wnl Elevated ast/alt Possible from sepsis, he dose Report h/o moderate alcohol use hepatitis panel negative, liver uKoreano acute findings lft trending down at discharge, pmd to repeat labs at hospital discharge follow up.  Impaired fasting blood sugar: likely from steroids use,  a1c5.4%  Mild coronary artery calcifications on CT chest, risk modification. Follow up with pmd.   Code Status: full  Family Communication: patient   Disposition Plan: home on 12/2  Consultants:  none  Procedures:  none  Antibiotics:  Rocephin/zithro    Discharge Exam: BP (!) 154/104 (BP Location: Left Arm)   Pulse (!) 113   Temp 98.1 F (36.7 C) (Oral)   Resp (!) 21   Ht '5\' 5"'$  (1.651 m)   Wt 92.1 kg (203 lb)   SpO2 92%    BMI 33.78 kg/m   General: NAD Cardiovascular: RRR Respiratory: bilateral diffuse wheezing has resolved, rhonchi has resolved  Discharge Instructions You were cared for by a hospitalist during your hospital stay. If you have any questions about your discharge medications or the care you received while you were in the hospital after you are discharged, you can call the unit and asked to speak with the hospitalist on call if the hospitalist that took care of you is not available. Once you are discharged, your primary care physician will handle any further medical issues. Please note that NO REFILLS for any discharge medications will be authorized once you are discharged, as it is imperative that you return to your primary care physician (or establish a relationship with a primary care physician if you do not have one) for your aftercare needs so that they can reassess your need for medications and monitor your lab values.  Discharge Instructions    Diet - low sodium heart healthy    Complete by:  As directed    Increase activity slowly    Complete by:  As directed        Medication List    STOP taking these medications   acetaminophen 325 MG tablet Commonly known as:  TYLENOL   dextromethorphan-guaiFENesin 30-600 MG 12hr tablet Commonly known as:  MUCINEX DM   guaiFENesin-codeine 100-10 MG/5ML syrup     TAKE these medications   albuterol 108 (90 Base) MCG/ACT inhaler Commonly known as:  PROVENTIL HFA;VENTOLIN HFA Inhale 1-2 puffs into the lungs every 6 (six) hours as needed for wheezing.   benzonatate 100 MG capsule Commonly known as:  TESSALON Take 1 capsule (100 mg total) by mouth every 8 (eight) hours.   doxycycline 100 MG capsule Commonly known as:  VIBRAMYCIN Take 1 capsule (100 mg total) by mouth 2 (two) times daily.   guaiFENesin 600 MG 12 hr tablet Commonly known as:  MUCINEX Take 1 tablet (600 mg total) by mouth 2 (two) times daily.   ibuprofen 200 MG  tablet Commonly known as:  ADVIL,MOTRIN Take 400 mg by mouth every 6 (six) hours as needed for pain.   predniSONE 10 MG tablet Commonly known as:  DELTASONE Label  & dispense according to the schedule below. 10 Pills PO on day one, 9 Pills PO on day two,  8 Pills PO on day three, 7 Pills PO on day four, 6 Pills PO on day five, 5 Pills PO on day six, 4 Pills  PO on day seven, 3pills po on day eight, 2pills on day nine, 1pill on day ten, then STOP.   triamcinolone cream 0.1 % Commonly known as:  KENALOG Apply 1 application topically 2 (two) times daily.      No Known Allergies Follow-up Information    LITTLE,[name], MD Follow up in 1 week(s).   Specialty:  Family Medicine Why:  hospital discharge follow up, follow liver function , pmd to refer you to pulmonologist ( lung specialist) Contact information: [address] [place] [address] [phone]            The results of significant diagnostics  from this hospitalization (including imaging, microbiology, ancillary and laboratory) are listed below for reference.    Significant Diagnostic Studies: Dg Chest 2 View  Result Date: 11/13/2016 CLINICAL DATA:  Acute onset of cough and fever. Shortness of breath and generalized chest pain. Initial encounter. EXAM: CHEST  2 VIEW COMPARISON:  Chest radiograph performed 10/22/2016 FINDINGS: The lungs are well-aerated. Mild peribronchial thickening is noted. There is no evidence of focal opacification, pleural effusion or pneumothorax. The heart is normal in size; the mediastinal contour is within normal limits. No acute osseous abnormalities are seen. IMPRESSION: Mild peribronchial thickening noted.  Lungs otherwise grossly clear. Electronically Signed   By: Garald Balding M.D.   On: 11/13/2016 21:58   Dg Chest 2 View  Result Date: 10/22/2016 CLINICAL DATA:  45 year old male with cough for the past week. Right-sided chest pain when coughing. Bronchitis 1 year ago with similar symptoms.  Nonsmoker. Initial encounter. EXAM: CHEST  2 VIEW COMPARISON:  09/12/2015. FINDINGS: Minimal peribronchial thickening may represent bronchitic/bronchitis type changes without evidence of segmental infiltrate. No pulmonary edema or pneumothorax. No plain film evidence of pulmonary malignancy. Heart size within normal limits. No acute osseous abnormality. IMPRESSION: Minimal peribronchial thickening similar to prior exam possibly representing bronchitis type changes. No segmental consolidation. Please see above. Electronically Signed   By: Genia Del M.D.   On: 10/22/2016 15:22   Ct Chest Wo Contrast  Result Date: 11/16/2016 CLINICAL DATA:  Cough. EXAM: CT CHEST WITHOUT CONTRAST TECHNIQUE: Multidetector CT imaging of the chest was performed following the standard protocol without IV contrast. COMPARISON:  Radiographs of November 13, 2016. FINDINGS: Cardiovascular: There is no evidence of thoracic aortic aneurysm. Mild coronary artery calcifications are noted. Mediastinum/Nodes: No enlarged mediastinal or axillary lymph nodes. Thyroid gland, trachea, and esophagus demonstrate no significant findings. Lungs/Pleura: Lungs are clear. No pleural effusion or pneumothorax. Upper Abdomen: No acute abnormality. Musculoskeletal: No chest wall mass or suspicious bone lesions identified. IMPRESSION: Mild coronary artery calcifications are noted which may represent coronary artery disease. No other significant abnormality seen in the chest. Electronically Signed   By: Marijo Conception, M.D.   On: 11/16/2016 12:04   US Abdomen Limited Ruq  Result Date: 11/15/2016 CLINICAL DATA:  Elevated LFTs EXAM: US ABDOMEN LIMITED - RIGHT UPPER QUADRANT COMPARISON:  CT 07/25/2014 FINDINGS: Gallbladder: No gallstones or wall thickening visualized. No sonographic Murphy sign noted by sonographer. Common bile duct: Diameter: 3.8 mm Liver: No focal lesion identified. Within normal limits in parenchymal echogenicity. IMPRESSION: Negative  right upper quadrant abdominal ultrasound Electronically Signed   By: Donavan Foil M.D.   On: 11/15/2016 18:38    Microbiology: Recent Results (from the past 240 hour(s))  Blood culture (routine x 2)     Status: None (Preliminary result)   Collection Time: 11/13/16  9:13 PM  Result Value Ref Range Status   Specimen Description BLOOD RIGHT ARM  Final   Special Requests IN PEDIATRIC BOTTLE 4CC  Final   Culture NO GROWTH 3 DAYS  Final   Report Status PENDING  Incomplete  Blood culture (routine x 2)     Status: None (Preliminary result)   Collection Time: 11/13/16  9:26 PM  Result Value Ref Range Status   Specimen Description BLOOD LEFT ANTECUBITAL  Final   Special Requests BOTTLES DRAWN AEROBIC AND ANAEROBIC 5CC  Final   Culture NO GROWTH 3 DAYS  Final   Report Status PENDING  Incomplete  Rapid strep screen     Status: None  Collection Time: 11/13/16 11:26 PM  Result Value Ref Range Status   Streptococcus, Group A Screen (Direct) NEGATIVE NEGATIVE Final    Comment: (NOTE) A Rapid Antigen test may result negative if the antigen level in the sample is below the detection level of this test. The FDA has not cleared this test as a stand-alone test therefore the rapid antigen negative result has reflexed to a Group A Strep culture.   Culture, group A strep     Status: None   Collection Time: 11/13/16 11:26 PM  Result Value Ref Range Status   Specimen Description THROAT  Final   Special Requests NONE Reflexed from [phone]  Final   Culture NO GROUP A STREP (S.PYOGENES) ISOLATED  Final   Report Status 11/16/2016 FINAL  Final  Respiratory Panel by PCR     Status: Abnormal   Collection Time: 11/14/16  7:16 AM  Result Value Ref Range Status   Adenovirus NOT DETECTED NOT DETECTED Final   Coronavirus 229E NOT DETECTED NOT DETECTED Final   Coronavirus HKU1 NOT DETECTED NOT DETECTED Final   Coronavirus NL63 NOT DETECTED NOT DETECTED Final   Coronavirus OC43 NOT DETECTED NOT DETECTED Final    Metapneumovirus NOT DETECTED NOT DETECTED Final   Rhinovirus / Enterovirus NOT DETECTED NOT DETECTED Final   Influenza A NOT DETECTED NOT DETECTED Final   Influenza B NOT DETECTED NOT DETECTED Final   Parainfluenza Virus 1 DETECTED (A) NOT DETECTED Final   Parainfluenza Virus 2 NOT DETECTED NOT DETECTED Final   Parainfluenza Virus 3 NOT DETECTED NOT DETECTED Final   Parainfluenza Virus 4 NOT DETECTED NOT DETECTED Final   Respiratory Syncytial Virus NOT DETECTED NOT DETECTED Final   Bordetella pertussis NOT DETECTED NOT DETECTED Final   Chlamydophila pneumoniae NOT DETECTED NOT DETECTED Final   Mycoplasma pneumoniae NOT DETECTED NOT DETECTED Final  Culture, sputum-assessment     Status: None   Collection Time: 11/14/16 10:59 AM  Result Value Ref Range Status   Specimen Description EXPECTORATED SPUTUM  Final   Special Requests NONE  Final   Sputum evaluation   Final    THIS SPECIMEN IS ACCEPTABLE. RESPIRATORY CULTURE REPORT TO FOLLOW.   Report Status 11/14/2016 FINAL  Final  Culture, respiratory (NON-Expectorated)     Status: None   Collection Time: 11/14/16 10:59 AM  Result Value Ref Range Status   Specimen Description SPUTUM  Final   Special Requests NONE  Final   Gram Stain   Final    MODERATE WBC PRESENT,BOTH PMN AND MONONUCLEAR MODERATE GRAM POSITIVE COCCI IN PAIRS IN CLUSTERS FEW GRAM POSITIVE RODS FEW GRAM NEGATIVE RODS FEW SQUAMOUS EPITHELIAL CELLS PRESENT    Culture Consistent with normal respiratory flora.  Final   Report Status 11/16/2016 FINAL  Final  MRSA PCR Screening     Status: None   Collection Time: 11/16/16  9:35 AM  Result Value Ref Range Status   MRSA by PCR NEGATIVE NEGATIVE Final    Comment:        The GeneXpert MRSA Assay (FDA approved for NASAL specimens only), is one component of a comprehensive MRSA colonization surveillance program. It is not intended to diagnose MRSA infection nor to guide or monitor treatment for MRSA infections.       Labs: Basic Metabolic Panel:  Recent Labs Lab 11/13/16 2057 11/14/16 0216 11/15/16 0254 11/16/16 0312 11/17/16 0439  NA 132* 136 137 137 136  K 3.9 2.8* 3.5 3.8 4.7  CL 99* 103 108 103 101  CO2 22 14* 21* 24 24  GLUCOSE 125* 312* 145* 128* 155*  BUN '10 10 8 11 12  '$ CREATININE 1.19 1.48* 0.85 0.83 0.89  CALCIUM 8.9 7.9* 8.0* 8.7* 9.5  MG  --   --  2.1  --  2.4   Liver Function Tests:  Recent Labs Lab 11/15/16 0254 11/16/16 0312 11/17/16 0439  AST 51* 45* 33  ALT 74* 80* 81*  ALKPHOS 57 56 59  BILITOT 0.2* 0.4 0.6  PROT 5.6* 6.2* 6.7  ALBUMIN 3.1* 3.2* 3.5   No results for input(s): LIPASE, AMYLASE in the last 168 hours. No results for input(s): AMMONIA in the last 168 hours. CBC:  Recent Labs Lab 11/13/16 2057 11/16/16 0312  WBC 6.3 10.0  NEUTROABS 3.6  --   HGB 19.2* 16.6  HCT 54.2* 48.0  MCV 94.4 94.9  PLT 142* 148*   Cardiac Enzymes:  Recent Labs Lab 11/17/16 0439  CKTOTAL 100   BNP: BNP (last 3 results) No results for input(s): BNP in the last 8760 hours.  ProBNP (last 3 results) No results for input(s): PROBNP in the last 8760 hours.  CBG: No results for input(s): GLUCAP in the last 168 hours.     SignedFlorencia Reasons MD, PhD  [facility] 11/17/2016, 10:22 AM

## 2016-11-17 NOTE — Progress Notes (Signed)
Patient discharge home per MD. NSL removed with catheter intact. Discharge instructions provided. Medications reviewed for doxycycline, prednisone, and mucinex. Pt verbalized understanding. Pt escorted out on foot with NT to main entrance. Bess KindsGWALTNEY, Channin Agustin B, RN

## 2016-11-17 NOTE — Plan of Care (Signed)
Problem: Respiratory: Goal: Pain level will decrease Outcome: Progressing Patient only complains of ribcage pain when coughing. Will continue to monitor.  Goal: Identification of resources available to assist in meeting health care needs will improve Outcome: Progressing Patient reports that [facility]

## 2016-11-18 LAB — CULTURE, BLOOD (ROUTINE X 2)
CULTURE: NO GROWTH
CULTURE: NO GROWTH

## 2016-12-18 IMAGING — US US ABDOMEN LIMITED
1 series · 14 of 25 positions shown · non-contrast
Comparison: CT 07/25/2014

CLINICAL DATA: Elevated LFTs

EXAM:
US ABDOMEN LIMITED - RIGHT UPPER QUADRANT

[Series 1: us abdomen limited · 0.25mm/px · 14 of 43 slices shown]
[im 1/43]
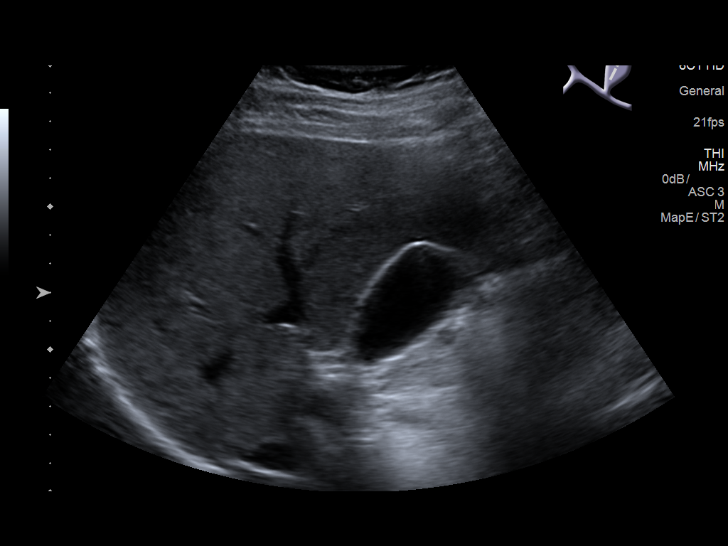
[im 4/43]
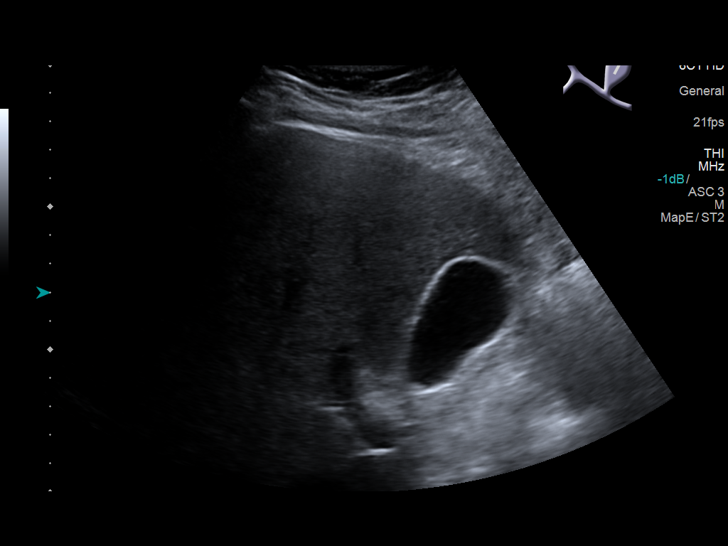
[im 8/43]
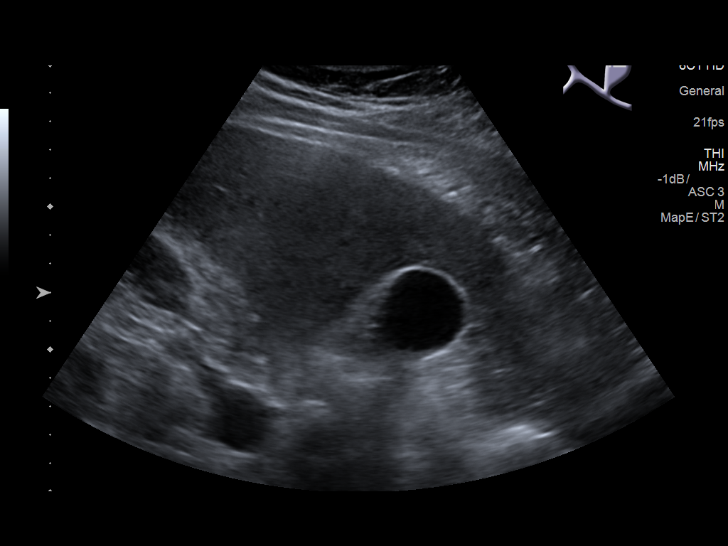
[im 11/43]
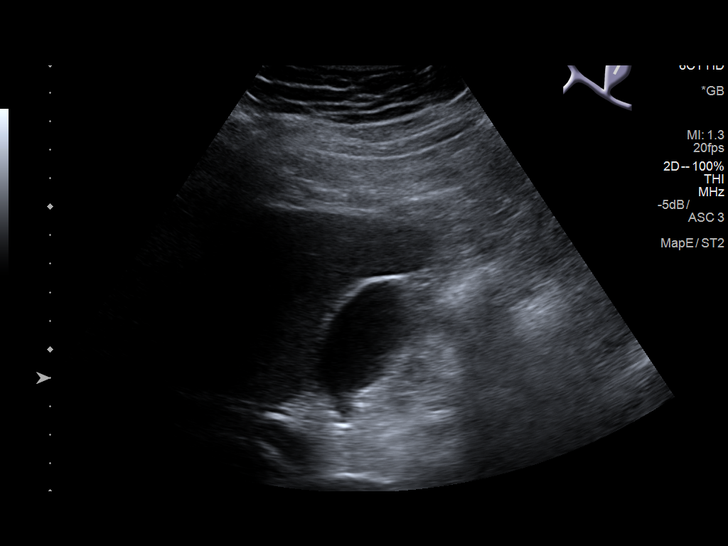
[im 15/43]
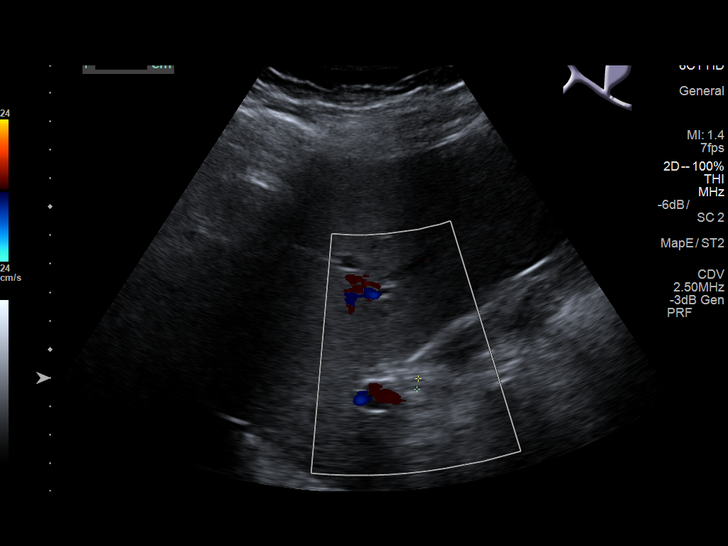
[im 16/43]
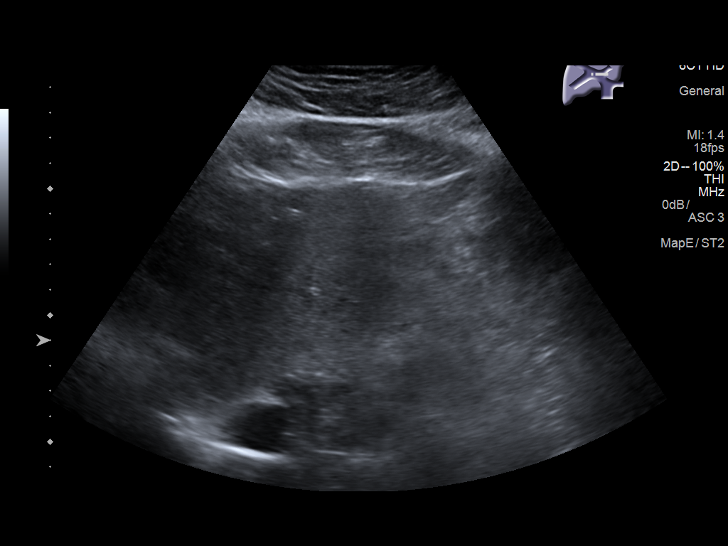
[im 20/43]
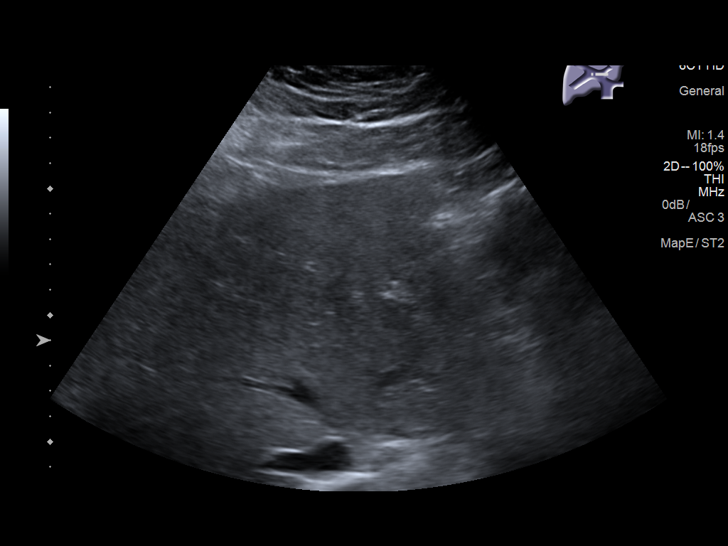
[im 23/43]
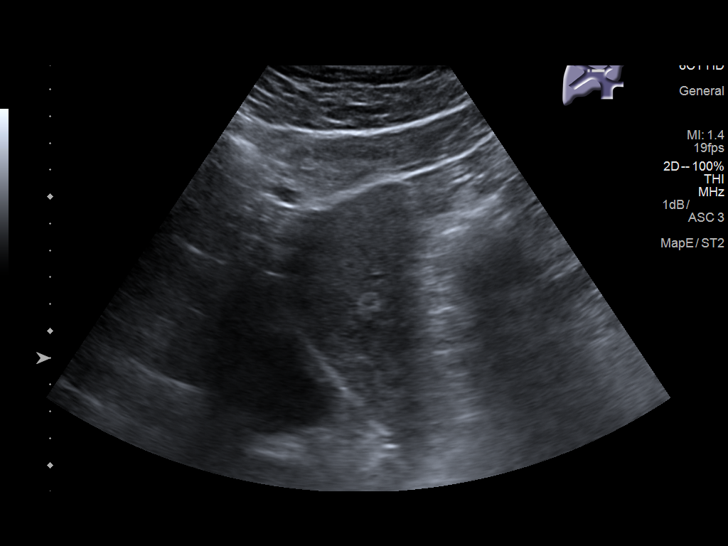
[im 27/43]
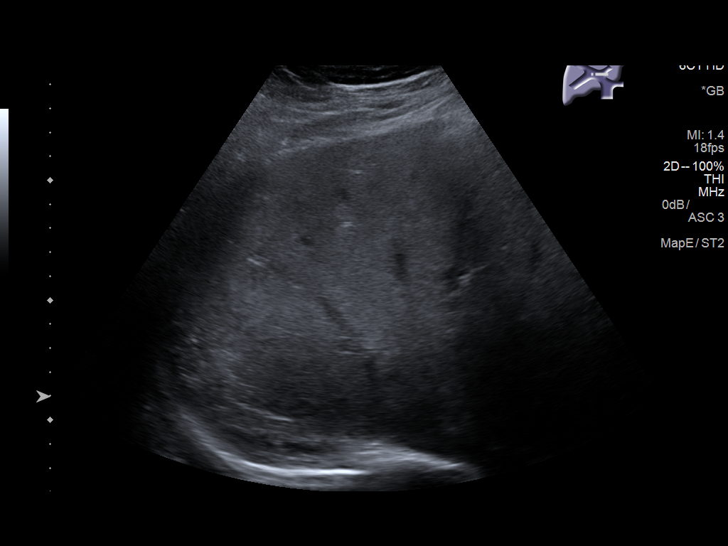
[im 29/43]
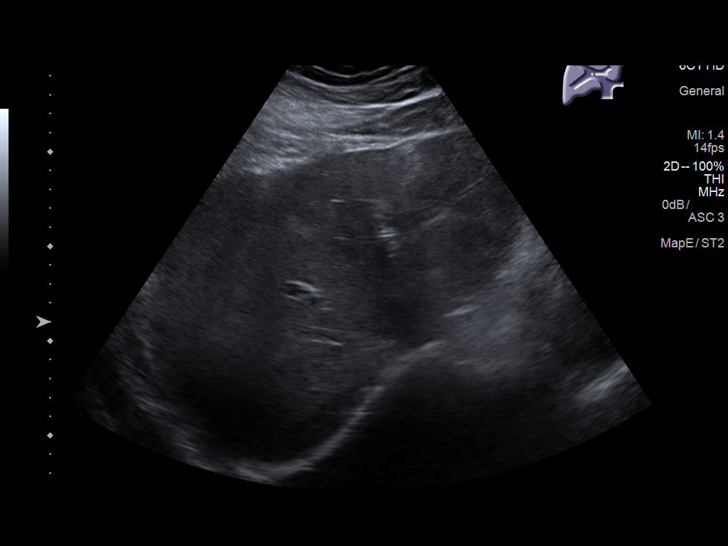
[im 32/43]
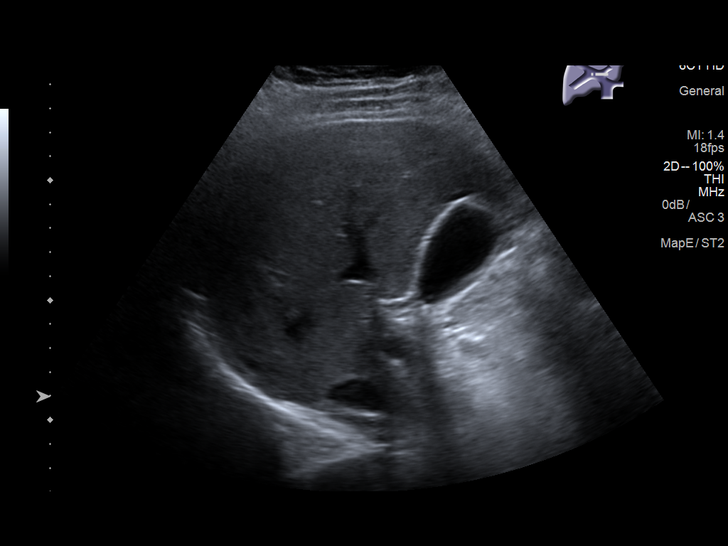
[im 36/43]
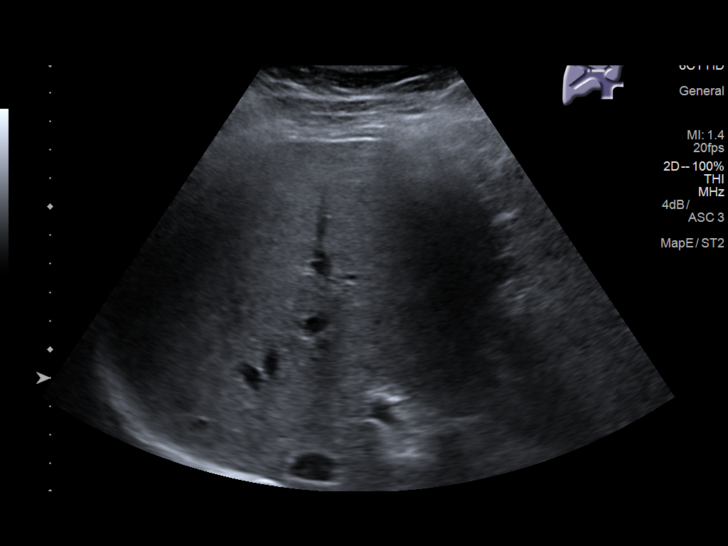
[im 39/43]
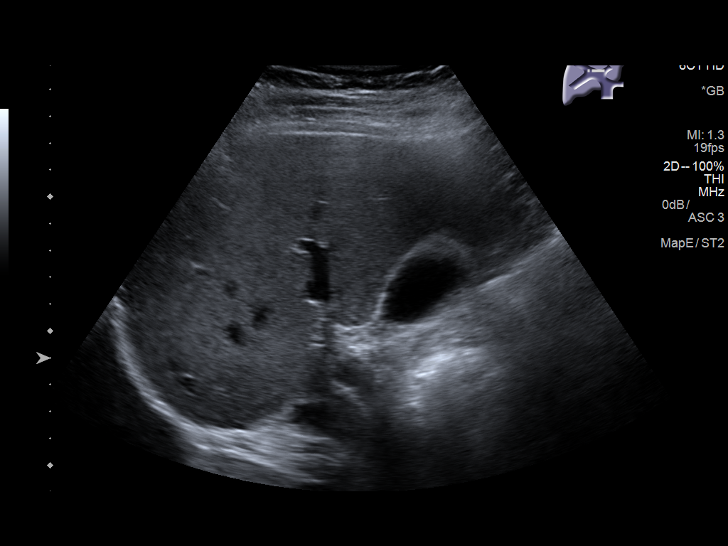
[im 43/43]
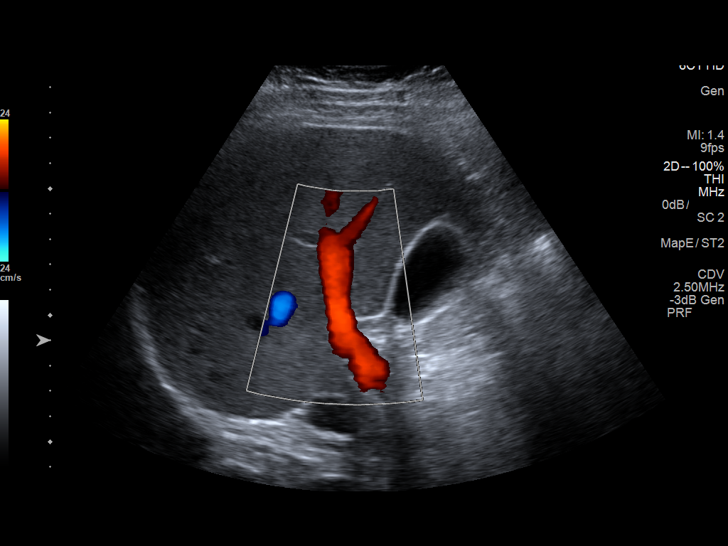

[14 of 25 positions shown; findings below may reference images not displayed]

FINDINGS: Gallbladder:

No gallstones or wall thickening visualized. No sonographic Murphy
sign noted by sonographer.

Common bile duct:

Diameter: 3.8 mm

Liver:

No focal lesion identified. Within normal limits in parenchymal
echogenicity.
IMPRESSION: Negative right upper quadrant abdominal ultrasound

## 2017-01-21 ENCOUNTER — Encounter (HOSPITAL_COMMUNITY): Payer: Self-pay | Admitting: Emergency Medicine

## 2017-01-21 ENCOUNTER — Emergency Department (HOSPITAL_COMMUNITY)
Admission: EM | Admit: 2017-01-21 | Discharge: 2017-01-21 | Disposition: A | Discharge status: Home / Self Care | Payer: 59 | Attending: Physician Assistant | Admitting: Physician Assistant

## 2017-01-21 ENCOUNTER — Emergency Department (HOSPITAL_COMMUNITY): Payer: 59

## 2017-01-21 DIAGNOSIS — Y999 Unspecified external cause status: Secondary | ICD-10-CM | POA: Insufficient documentation

## 2017-01-21 DIAGNOSIS — M25562 Pain in left knee: Secondary | ICD-10-CM

## 2017-01-21 DIAGNOSIS — Y929 Unspecified place or not applicable: Secondary | ICD-10-CM | POA: Insufficient documentation

## 2017-01-21 DIAGNOSIS — W108XXA Fall (on) (from) other stairs and steps, initial encounter: Secondary | ICD-10-CM | POA: Insufficient documentation

## 2017-01-21 DIAGNOSIS — Y9301 Activity, walking, marching and hiking: Secondary | ICD-10-CM | POA: Diagnosis not present

## 2017-01-21 NOTE — ED Triage Notes (Signed)
Left knee pain after falling 2 weeks ago  Taking otc meds not helping states heard a crack

## 2017-01-21 NOTE — Discharge Instructions (Signed)
Rest - please stay off knee as much as possible Ice - ice for 20 minutes at a time, several times a day Compression - wear brace to provide support Elevate - elevate knee above level of heart Ibuprofen - take with food. Take up to 3-4 times daily. You can take Tylenol with this as well. Follow up with Orthopedics

## 2017-01-21 NOTE — ED Notes (Signed)
Patient taken to XRAY

## 2017-01-21 NOTE — ED Notes (Signed)
Patient Alert and oriented X4. Stable and ambulatory. Patient verbalized understanding of the discharge instructions.  Patient belongings were taken by the patient.  

## 2017-01-21 NOTE — ED Notes (Signed)
Patient is A&Ox4 at this time.  Patient in no signs of distress.  Please see providers note for complete history and physical exam.  

## 2017-01-22 NOTE — ED Provider Notes (Signed)
[facility]-EMERGENCY DEPT Provider Note   CSN: [id number] Arrival date & time: 01/21/17  1356     History   Chief Complaint Chief Complaint  Patient presents with  . Knee Pain    HPI Cody Weiss is a 46 y.o. male who presents with L knee pain. He states he was walking down stairs 2 weeks ago and felt a "pop" and lost his balance and fell. He reports immediate severe pain and significant swelling to the knee. The pain is mostly on the medial and anterior aspect of knee. Walking makes it worse. Resting makes it better. He has been taking Ibuprofen without significant relief. He is still working and has a physical job which has been aggravating it. Denies instability. He has never injured the knee before.   HPI  Past Medical History:  Diagnosis Date  . Bronchitis   . Pneumonia 10/2016    Patient Active Problem List   Diagnosis Date Noted  . CAP (community acquired pneumonia) 11/14/2016  . Acute bronchitis 11/14/2016    Past Surgical History:  Procedure Laterality Date  . HAND SURGERY    . HERNIA REPAIR         Home Medications    Prior to Admission medications   Medication Sig Start Date End Date Taking? Authorizing Provider  albuterol (PROVENTIL HFA;VENTOLIN HFA) 108 (90 Base) MCG/ACT inhaler Inhale 1-2 puffs into the lungs every 6 (six) hours as needed for wheezing. 10/22/16   [name], MD  benzonatate (TESSALON) 100 MG capsule Take 1 capsule (100 mg total) by mouth every 8 (eight) hours. 10/22/16   [name], MD  guaiFENesin (MUCINEX) 600 MG 12 hr tablet Take 1 tablet (600 mg total) by mouth 2 (two) times daily. 11/17/16   Albertine GratesFang Xu, MD  ibuprofen (ADVIL,MOTRIN) 200 MG tablet Take 400 mg by mouth every 6 (six) hours as needed for pain.    Historical Provider, MD  predniSONE (DELTASONE) 10 MG tablet Label  & dispense according to the schedule below. 10 Pills PO on day one, 9 Pills PO on day two,  8 Pills PO on day three, 7 Pills PO on day four, 6 Pills PO on day five, 5 Pills  PO on day six, 4 Pills  PO on day seven, 3pills po on day eight, 2pills on day nine, 1pill on day ten, then STOP. 11/17/16   Albertine GratesFang Xu, MD  triamcinolone cream (KENALOG) 0.1 % Apply 1 application topically 2 (two) times daily. 10/22/16   [name], MD    Family History No family history on file.  Social History Social History  Substance Use Topics  . Smoking status: Never Smoker  . Smokeless tobacco: Current User    Types: Snuff  . Alcohol use Yes     Comment: occ     Allergies   Patient has no known allergies.   Review of Systems Review of Systems  Musculoskeletal: Positive for arthralgias, gait problem and joint swelling.  Skin: Negative for wound.  Neurological: Negative for numbness.     Physical Exam Updated Vital Signs BP 121/80   Pulse 88   Temp 98.7 F (37.1 C) (Oral)   Resp 16   SpO2 100%   Physical Exam  Constitutional: He is oriented to person, place, and time. He appears well-developed and well-nourished. No distress.  HENT:  Head: Normocephalic and atraumatic.  Eyes: Conjunctivae are normal. Pupils are equal, round, and reactive to light. Right eye exhibits no discharge. Left eye exhibits no discharge. No  scleral icterus.  Neck: Normal range of motion.  Cardiovascular: Normal rate.   Pulmonary/Chest: Effort normal. No respiratory distress.  Abdominal: He exhibits no distension.  Musculoskeletal:  Left knee: Mild swelling over medial and lateral aspect of knee. Tenderness to palpation over medial and lateral joint line. Decreased ROM of knee due to pain and swelling. N/V intact.   Neurological: He is alert and oriented to person, place, and time.  Skin: Skin is warm and dry.  Psychiatric: He has a normal mood and affect. His behavior is normal.  Nursing note and vitals reviewed.    ED Treatments / Results  Labs (all labs ordered are listed, but only abnormal results are displayed) Labs Reviewed - No data to display  EKG  EKG  Interpretation None       Radiology Dg Knee Complete 4 Views Left  Result Date: 01/21/2017 CLINICAL DATA:  Left knee pain since the patient slipped and fell twisting his knee 2 weeks ago. EXAM: LEFT KNEE - COMPLETE 4+ VIEW COMPARISON:  None. FINDINGS: No evidence of fracture or dislocation. Probable small effusion. No evidence of arthropathy or other focal bone abnormality. Soft tissues are unremarkable. IMPRESSION: Small joint effusion. Electronically Signed   By: Francene Boyers M.D.   On: 01/21/2017 15:36    Procedures Procedures (including critical care time)  Medications Ordered in ED Medications - No data to display   Initial Impression / Assessment and Plan / ED Course  I have reviewed the triage vital signs and the nursing notes.  Pertinent labs & imaging results that were available during my care of the patient were reviewed by me and considered in my medical decision making (see chart for details).  46 year old male presents with knee sprain. Vitals normal. Xray remarkable for small joint effusion. Exam limited due to pain and swelling but I am concerned for soft tissue injury such as ligament tear or meniscus tear. Advised RICE protocol and follow up with Ortho. Tylenol and Ibuprofen for pain. Opportunity for questions provided and all questions answered. Return precautions given.   Final Clinical Impressions(s) / ED Diagnoses   Final diagnoses:  Acute pain of left knee    New Prescriptions Discharge Medication List as of 01/21/2017  4:15 PM       Bethel Born, PA-C 01/22/17 1231    [name], MD 01/24/17 1610

## 2017-10-07 ENCOUNTER — Encounter (HOSPITAL_COMMUNITY): Payer: Self-pay | Admitting: Emergency Medicine

## 2017-10-07 ENCOUNTER — Emergency Department (HOSPITAL_COMMUNITY)
Admission: EM | Admit: 2017-10-07 | Discharge: 2017-10-07 | Disposition: A | Discharge status: Home / Self Care | Payer: 59 | Attending: Emergency Medicine | Admitting: Emergency Medicine

## 2017-10-07 DIAGNOSIS — K0889 Other specified disorders of teeth and supporting structures: Secondary | ICD-10-CM | POA: Diagnosis present

## 2017-10-07 DIAGNOSIS — Z79899 Other long term (current) drug therapy: Secondary | ICD-10-CM | POA: Insufficient documentation

## 2017-10-07 DIAGNOSIS — K029 Dental caries, unspecified: Secondary | ICD-10-CM | POA: Diagnosis not present

## 2017-10-07 MED ORDER — OXYCODONE-ACETAMINOPHEN 5-325 MG PO TABS
1.0000 | ORAL_TABLET | Freq: Once | ORAL | Status: AC
  Administered 2017-10-07: 1 via ORAL
  Filled 2017-10-07: qty 1

## 2017-10-07 MED ORDER — TRAMADOL HCL 50 MG PO TABS: 50.0000 mg | ORAL_TABLET | Freq: Four times a day (QID) | ORAL | 0 refills | Status: DC | PRN

## 2017-10-07 MED ORDER — IBUPROFEN 400 MG PO TABS
600.0000 mg | ORAL_TABLET | Freq: Once | ORAL | Status: AC
  Administered 2017-10-07: 600 mg via ORAL
  Filled 2017-10-07: qty 1

## 2017-10-07 MED ORDER — PENICILLIN V POTASSIUM 500 MG PO TABS: 500.0000 mg | ORAL_TABLET | Freq: Two times a day (BID) | ORAL | 0 refills | Status: AC

## 2017-10-07 NOTE — ED Notes (Signed)
Pt verbalizes understanding of d/c instructions. Pt received prescriptions. Pt ambulatory at d/c with all belongings.  

## 2017-10-07 NOTE — Discharge Instructions (Signed)
Please read and follow all provided instructions.  Your diagnoses today include:  1. Pain, dental   2. Dental caries     Tests performed today include: Vital signs. See below for your results today.   Medications prescribed:  Take as prescribed   Home care instructions:  Follow any educational materials contained in this packet.  Follow-up instructions: Please follow-up with a Dentist for further evaluation of symptoms and treatment   ALSO you can:  Contact this number: [phone] to get into contact with an emergency dental service to schedule an appointment with a local dentist    Return instructions:  Please return to the Emergency Department if you do not get better, if you get worse, or new symptoms OR  - Fever (temperature greater than 101.52F)  - Bleeding that does not stop with holding pressure to the area    -Severe pain (please note that you may be more sore the day after your accident)  - Chest Pain  - Difficulty breathing  - Severe nausea or vomiting  - Inability to tolerate food and liquids  - Passing out  - Skin becoming red around your wounds  - Change in mental status (confusion or lethargy)  - New numbness or weakness    Please return if you have any other emergent concerns.  Additional Information:  Your vital signs today were: BP (!) 161/111 (BP Location: Right Arm)    Pulse 60    Temp 97.7 F (36.5 C) (Oral)    Resp 18    Ht 5\' 5"  (1.651 m)    Wt 95.3 kg (210 lb)    SpO2 98%    BMI 34.95 kg/m  If your blood pressure (BP) was elevated above 135/85 this visit, please have this repeated by your doctor within one month. ---------------

## 2017-10-07 NOTE — ED Triage Notes (Signed)
Reports tooth on right upper side bothering him for about a week.

## 2017-10-07 NOTE — ED Notes (Signed)
See EDP assessment 

## 2017-10-07 NOTE — ED Provider Notes (Signed)
[facility] EMERGENCY [facility] Provider Note   CSN: [id number] Arrival date & time: 10/07/17  [id number]     History   Chief Complaint Chief Complaint  Patient presents with  . Dental Pain    HPI Cody Weiss is a 46 y.o. male.  HPI  [age] y.o. male, presents to the Emergency Department today due to right upper dental pain x 1 week. Notes hx same. Seen by dentist and told he needed his tooth pulled. Pt states it was too expensive. Rates pain 8/10. Throbbing. No swelling. No fevers. Able to tolerate PO. Notes putting Aspirin powder on it with minimal relief. No other symptoms noted .   Past Medical History:  Diagnosis Date  . Bronchitis   . Pneumonia 10/2016    Patient Active Problem List   Diagnosis Date Noted  . CAP (community acquired pneumonia) 11/14/2016  . Acute bronchitis 11/14/2016    Past Surgical History:  Procedure Laterality Date  . HAND SURGERY    . HERNIA REPAIR         Home Medications    Prior to Admission medications   Medication Sig Start Date End Date Taking? Authorizing Provider  albuterol (PROVENTIL HFA;VENTOLIN HFA) 108 (90 Base) MCG/ACT inhaler Inhale 1-2 puffs into the lungs every 6 (six) hours as needed for wheezing. 10/22/16   Rolland PorterJames, Mark, MD  benzonatate (TESSALON) 100 MG capsule Take 1 capsule (100 mg total) by mouth every 8 (eight) hours. 10/22/16   Rolland PorterJames, Mark, MD  guaiFENesin (MUCINEX) 600 MG 12 hr tablet Take 1 tablet (600 mg total) by mouth 2 (two) times daily. 11/17/16   Albertine GratesXu, Fang, MD  ibuprofen (ADVIL,MOTRIN) 200 MG tablet Take 400 mg by mouth every 6 (six) hours as needed for pain.    [provider]  predniSONE (DELTASONE) 10 MG tablet Label  & dispense according to the schedule below. 10 Pills PO on day one, 9 Pills PO on day two,  8 Pills PO on day three, 7 Pills PO on day four, 6 Pills PO on day five, 5 Pills PO on day six, 4 Pills  PO on day seven, 3pills po on day eight, 2pills on day nine, 1pill on day  ten, then STOP. 11/17/16   Albertine GratesXu, Fang, MD  triamcinolone cream (KENALOG) 0.1 % Apply 1 application topically 2 (two) times daily. 10/22/16   Rolland PorterJames, Mark, MD    Family History No family history on file.  Social History Social History  Substance Use Topics  . Smoking status: Never Smoker  . Smokeless tobacco: Current User    Types: Snuff  . Alcohol use Yes     Comment: occ     Allergies   Patient has no known allergies.   Review of Systems Review of Systems ROS reviewed and all are negative for acute change except as noted in the HPI.  Physical Exam Updated Vital Signs BP (!) 161/111 (BP Location: Right Arm)   Pulse 60   Temp 97.7 F (36.5 C) (Oral)   Resp 18   Ht 5\' 5"  (1.651 m)   Wt 95.3 kg (210 lb)   SpO2 98%   BMI 34.95 kg/m   Physical Exam  Constitutional: He is oriented to person, place, and time. Vital signs are normal. He appears well-developed and well-nourished.  HENT:  Head: Normocephalic and atraumatic.  Right Ear: Hearing normal.  Left Ear: Hearing normal.  Mouth/Throat: Uvula is midline, oropharynx is clear and moist and mucous membranes are normal. No  trismus in the jaw. Dental caries present. No dental abscesses. No oropharyngeal exudate, posterior oropharyngeal edema, posterior oropharyngeal erythema or tonsillar abscesses.  Poor dentition. No dental abscess. Uvula midline. No Trismus. No Ludwigs.   Eyes: Pupils are equal, round, and reactive to light. Conjunctivae and EOM are normal.  Neck: Normal range of motion. Neck supple.  Cardiovascular: Normal rate, regular rhythm and normal heart sounds.   Pulmonary/Chest: Effort normal.  Musculoskeletal: Normal range of motion.  Neurological: He is alert and oriented to person, place, and time.  Skin: Skin is warm and dry.  Psychiatric: He has a normal mood and affect. His speech is normal and behavior is normal. Thought content normal.  Nursing note and vitals reviewed.    ED Treatments / Results   Labs (all labs ordered are listed, but only abnormal results are displayed) Labs Reviewed - No data to display  EKG  EKG Interpretation None       Radiology No results found.  Procedures Procedures (including critical care time)  Medications Ordered in ED Medications - No data to display   Initial Impression / Assessment and Plan / ED Course  I have reviewed the triage vital signs and the nursing notes.  Pertinent labs & imaging results that were available during my care of the patient were reviewed by me and considered in my medical decision making (see chart for details).  Final Clinical Impressions(s) / ED Diagnoses     {I have reviewed the relevant previous healthcare records.  {I obtained HPI from historian.   ED Course:  Assessment: Dental pain associated with dental cary but no signs or symptoms of dental abscess with patient afebrile, non toxic appearing and swallowing secretions well. Exam unconcerning for Ludwig's angina or other deep tissue infection in neck. Dental caries notes without abscess I gave patient referral to dentist and stressed the importance of dental follow up for ultimate management of dental pain. Patient voices understanding and is agreeable to plan.  Disposition/Plan:  DC Home Additional Verbal discharge instructions given and discussed with patient.  Pt Instructed to f/u with PCP in the next week for evaluation and treatment of symptoms. Return precautions given Pt acknowledges and agrees with plan  Supervising Physician Dione Booze, MD  Final diagnoses:  Pain, dental  Dental caries    New Prescriptions New Prescriptions   No medications on file     Audry Pili, Cordelia Poche 10/07/17 9147    Dione Booze, MD 10/07/17 [phone]

## 2018-04-24 ENCOUNTER — Emergency Department (HOSPITAL_COMMUNITY)
Admission: EM | Admit: 2018-04-24 | Discharge: 2018-04-24 | Disposition: A | Discharge status: Home / Self Care | Payer: 59 | Attending: Emergency Medicine | Admitting: Emergency Medicine

## 2018-04-24 ENCOUNTER — Other Ambulatory Visit: Payer: Self-pay

## 2018-04-24 ENCOUNTER — Emergency Department (HOSPITAL_COMMUNITY): Payer: 59

## 2018-04-24 ENCOUNTER — Encounter (HOSPITAL_COMMUNITY): Payer: Self-pay | Admitting: Emergency Medicine

## 2018-04-24 DIAGNOSIS — F17290 Nicotine dependence, other tobacco product, uncomplicated: Secondary | ICD-10-CM | POA: Diagnosis not present

## 2018-04-24 DIAGNOSIS — Z79899 Other long term (current) drug therapy: Secondary | ICD-10-CM | POA: Insufficient documentation

## 2018-04-24 DIAGNOSIS — M545 Low back pain, unspecified: Secondary | ICD-10-CM

## 2018-04-24 DIAGNOSIS — I10 Essential (primary) hypertension: Secondary | ICD-10-CM | POA: Insufficient documentation

## 2018-04-24 HISTORY — DX: Essential (primary) hypertension: I10

## 2018-04-24 NOTE — ED Triage Notes (Signed)
Patient complaining of right lower back pain that started when he slipped and caught himself. Ambulates interdependently without difficulty.

## 2018-04-24 NOTE — Discharge Instructions (Signed)
Back Pain:  The x-ray of your lower back did not show any acute fractures or dislocations.  It showed findings of degeneration which were present on previous imaging you have had done.  Please continue to take the Robaxin, hydrocodone, and ibuprofen that were prescribed by your orthopedic doctor and your primary care provider.  The application of heat can help soothe the pain.  Maintaining your daily activities, including walking, is encourged, as it will help you get better faster than just staying in bed.  You may also asked the pharmacist about topical lidocaine patches, you may place these directly over the areas of pain.  There are numbing medication that will help soothe the area of discomfort.  Your pain should get better over the next 1- 2 weeks.  You will need to follow up with  Your primary healthcare provider or your orthopedic doctor in 1 week for reassessment, if you do not have a primary care provider one is provided in your discharge instructions. However if you develop severe or worsening pain, low back pain with fever, numbness, weakness, loss of bowel or bladder control, or inability to walk or urinate, you should return to the ER immediately.  Please follow up with your doctor this week for a recheck if still having symptoms.

## 2018-04-24 NOTE — ED Provider Notes (Signed)
[facility] EMERGENCY DEPARTMENT Provider Note   CSN: [id number] Arrival date & time: 04/24/18  1332     History   Chief Complaint Chief Complaint  Patient presents with  . Back Pain    HPI Cody Weiss is a 47 y.o. male with a history of hypertension who presents the emergency department with complaint of right lower back pain for the past 4 days.  Patient states he was ambulating, his foot slipped, and he twisted wrong causing pain in his right lower back.  He did not fall, have any head injury, or any loss of consciousness with this slip incident.  He has had constant pain to the right lower back.  He was seen by his PCP the day following the incident, he was told this was a tendon injury and was given prescriptions for hydrocodone and Robaxin which he has been taking as prescribed.  He states that these medications helped his pain somewhat.  Current pain is a 4 out of 10 in severity.  He states that after hydrocodone and Robaxin it becomes a 1-2 out of 10.  It is worse with twisting movements and with ambulation. Denies numbness, tingling, weakness, incontinence to bowel/bladder, fever, chills, IV drug use, or hx of cancer.   HPI  Past Medical History:  Diagnosis Date  . Bronchitis   . Hypertension   . Pneumonia 10/2016    Patient Active Problem List   Diagnosis Date Noted  . CAP (community acquired pneumonia) 11/14/2016  . Acute bronchitis 11/14/2016    Past Surgical History:  Procedure Laterality Date  . HAND SURGERY    . HERNIA REPAIR          Home Medications    Prior to Admission medications   Medication Sig Start Date End Date Taking? Authorizing Provider  albuterol (PROVENTIL HFA;VENTOLIN HFA) 108 (90 Base) MCG/ACT inhaler Inhale 1-2 puffs into the lungs every 6 (six) hours as needed for wheezing. 10/22/16   Rolland Porter, MD  benzonatate (TESSALON) 100 MG capsule Take 1 capsule (100 mg total) by mouth every 8 (eight) hours. 10/22/16   Rolland Porter, MD  guaiFENesin (MUCINEX) 600 MG 12 hr tablet Take 1 tablet (600 mg total) by mouth 2 (two) times daily. 11/17/16   Albertine Grates, MD  ibuprofen (ADVIL,MOTRIN) 200 MG tablet Take 400 mg by mouth every 6 (six) hours as needed for pain.    [provider]  predniSONE (DELTASONE) 10 MG tablet Label  & dispense according to the schedule below. 10 Pills PO on day one, 9 Pills PO on day two,  8 Pills PO on day three, 7 Pills PO on day four, 6 Pills PO on day five, 5 Pills PO on day six, 4 Pills  PO on day seven, 3pills po on day eight, 2pills on day nine, 1pill on day ten, then STOP. 11/17/16   Albertine Grates, MD  traMADol (ULTRAM) 50 MG tablet Take 1 tablet (50 mg total) by mouth every 6 (six) hours as needed. 10/07/17   Audry Pili, PA-C  triamcinolone cream (KENALOG) 0.1 % Apply 1 application topically 2 (two) times daily. 10/22/16   Rolland Porter, MD    Family History History reviewed. No pertinent family history.  Social History Social History   Tobacco Use  . Smoking status: Never Smoker  . Smokeless tobacco: Current User    Types: Snuff  Substance Use Topics  . Alcohol use: Yes    Comment: occ  . Drug use:  No     Allergies   Patient has no known allergies.   Review of Systems Review of Systems  Constitutional: Negative for appetite change, chills, fever and unexpected weight change.  Gastrointestinal: Negative for abdominal pain.  Musculoskeletal: Positive for back pain.  Neurological: Negative for weakness and numbness.       Negative for incontinence/saddle anesthesia    Physical Exam Updated Vital Signs BP (!) 137/92 (BP Location: Right Arm)   Pulse 100   Temp 99.5 F (37.5 C)   Resp 16   SpO2 96%   Physical Exam  Constitutional: He appears well-developed and well-nourished. No distress.  HENT:  Head: Normocephalic and atraumatic.  Eyes: Conjunctivae are normal. Right eye exhibits no discharge. Left eye exhibits no discharge.  Cardiovascular: Normal rate and  regular rhythm.  No murmur heard. Abdominal: Soft. He exhibits no distension. There is no tenderness.  Musculoskeletal:  No obvious deformity, appreciable swelling, erythema, ecchymosis, or open wounds. Back: No midline tenderness to palpation.  Patient has right lumbar paraspinal muscle tenderness to palpation.  Neurological: He is alert.  Clear speech.  Sensation grossly intact to bilateral lower extremities.  Patellar DTRs are 2+ and symmetric.  5 out of 5 strength with plantar dorsiflexion bilaterally.  Gait is intact but antalgic.  Psychiatric: He has a normal mood and affect. His behavior is normal. Thought content normal.  Nursing note and vitals reviewed.   ED Treatments / Results  Labs (all labs ordered are listed, but only abnormal results are displayed) Labs Reviewed - No data to display  EKG None  Radiology Dg Lumbar Spine Complete  Result Date: 04/24/2018 CLINICAL DATA:  Lower back pain since a slip/twist 4 days ago. Pt states he's been to his PC and meds ice/heat are not helping with pain. EXAM: LUMBAR SPINE - COMPLETE 4+ VIEW COMPARISON:  CT, 07/25/2014 FINDINGS: Bilateral pars defects at L5-S1 with a minor anterolisthesis. This is stable from the prior CT scan. No acute fracture.  No bone lesions. Mild loss of disc height at L5-S1. Remaining disc spaces are well preserved. Facet joints are well preserved. Soft tissues are unremarkable. IMPRESSION: 1. No acute finding. 2. 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 Electronically Signed   By: Amie Portland M.D.   On: 04/24/2018 15:36    Procedures Procedures (including critical care time)  Medications Ordered in ED Medications - No data to display   Initial Impression / Assessment and Plan / ED Course  I have reviewed the triage vital signs and the nursing notes.  Pertinent labs & imaging results that were available during my care of the patient  were reviewed by me and considered in my medical decision making (see chart for details).   Patient presents with complaint of right lower back pain.  Patient nontoxic-appearing, in no apparent distress, vitals WNL with the exception of elevated diastolic blood pressure, do not suspect hypertensive emergency, patient aware of need for recheck.  On exam patient has tenderness in the right lumbar paraspinal region.  There is no focal bony tenderness, specifically no midline tenderness.  Patient and his wife fairly persistent reguarding obtaining x-ray, Lumbar spine x-ray ordered at their request, negative for acute abnormalities, there are chronic findings which appear stable.  Likely lumbar spinal muscle strain versus spasm.  Additional DDX considered includes disc disease, pyelonephritis, nephrolithiasis, cauda equina, epidural abscess, or tumor.  Given clinical picture feel these are each  very unlikely. No back pain red flags. Patient able to ambulate.  Discussed continuation of his hydrocodone and Robaxin prescribed by his primary care provider.  Discussed anti-inflammatories, patient reports his orthopedic doctor just called in enteric-coated 800 mg ibuprofen for him today.  I instructed he take this as prescribed.  Also recommended lidocaine patches. I discussed results, treatment plan, need for PCP/ortho follow-up, and return precautions with the patient. Provided opportunity for questions, patient confirmed understanding and is in agreement with plan.    Final Clinical Impressions(s) / ED Diagnoses   Final diagnoses:  Acute right-sided low back pain without sciatica    ED Discharge Orders    None       Cherly Anderson, PA-C 04/24/18 1605    Vanetta Mulders, MD 04/24/18 [phone]

## 2018-11-17 ENCOUNTER — Encounter (HOSPITAL_COMMUNITY): Payer: Self-pay | Admitting: *Deleted

## 2018-11-17 ENCOUNTER — Emergency Department (HOSPITAL_COMMUNITY)
Admission: EM | Admit: 2018-11-17 | Discharge: 2018-11-18 | Disposition: A | Discharge status: Home / Self Care | Payer: 59 | Attending: Emergency Medicine | Admitting: Emergency Medicine

## 2018-11-17 DIAGNOSIS — J40 Bronchitis, not specified as acute or chronic: Secondary | ICD-10-CM | POA: Diagnosis not present

## 2018-11-17 DIAGNOSIS — R05 Cough: Secondary | ICD-10-CM | POA: Diagnosis present

## 2018-11-17 LAB — CBC
HCT: 52.2 % — ABNORMAL HIGH (ref 39.0–52.0)
Hemoglobin: 17.2 g/dL — ABNORMAL HIGH (ref 13.0–17.0)
MCH: 32.1 pg (ref 26.0–34.0)
MCHC: 33 g/dL (ref 30.0–36.0)
MCV: 97.4 fL (ref 80.0–100.0)
NRBC: 0 % (ref 0.0–0.2)
PLATELETS: 190 10*3/uL (ref 150–400)
RBC: 5.36 MIL/uL (ref 4.22–5.81)
RDW: 11.5 % (ref 11.5–15.5)
WBC: 7.6 10*3/uL (ref 4.0–10.5)

## 2018-11-17 LAB — COMPREHENSIVE METABOLIC PANEL
ALK PHOS: 57 U/L (ref 38–126)
ALT: 43 U/L (ref 0–44)
AST: 36 U/L (ref 15–41)
Albumin: 3.8 g/dL (ref 3.5–5.0)
Anion gap: 11 (ref 5–15)
BUN: 6 mg/dL (ref 6–20)
CALCIUM: 9.1 mg/dL (ref 8.9–10.3)
CO2: 26 mmol/L (ref 22–32)
Chloride: 99 mmol/L (ref 98–111)
Creatinine, Ser: 0.94 mg/dL (ref 0.61–1.24)
GFR calc non Af Amer: >60 mL/min (ref >60)
Glucose, Bld: 104 mg/dL — ABNORMAL HIGH (ref 70–99)
Potassium: 3.7 mmol/L (ref 3.5–5.1)
SODIUM: 136 mmol/L (ref 135–145)
Total Bilirubin: 1.1 mg/dL (ref 0.3–1.2)
Total Protein: 7 g/dL (ref 6.5–8.1)

## 2018-11-17 LAB — URINALYSIS, ROUTINE W REFLEX MICROSCOPIC
Bilirubin Urine: NEGATIVE
GLUCOSE, UA: NEGATIVE mg/dL
Hgb urine dipstick: NEGATIVE
Ketones, ur: NEGATIVE mg/dL
LEUKOCYTES UA: NEGATIVE
NITRITE: NEGATIVE
PROTEIN: NEGATIVE mg/dL
Specific Gravity, Urine: 1.006 (ref 1.005–1.030)
pH: 6 (ref 5.0–8.0)

## 2018-11-17 LAB — LIPASE, BLOOD: LIPASE: 70 U/L — AB (ref 11–51)

## 2018-11-17 NOTE — ED Triage Notes (Addendum)
Pt says he has had cough, congestion, vomiting after cough for over a week. Reports saw his doctor and completed Z pac, has had diarrhea since completing z pac. 8 episodes diarrhea today.  Denies fevers.

## 2018-11-18 ENCOUNTER — Emergency Department (HOSPITAL_COMMUNITY): Payer: 59

## 2018-11-18 MED ORDER — HYDROCODONE-HOMATROPINE 5-1.5 MG/5ML PO SYRP: 5.0000 mL | ORAL_SOLUTION | Freq: Four times a day (QID) | ORAL | 0 refills | Status: DC | PRN

## 2018-11-18 MED ORDER — PREDNISONE 20 MG PO TABS
60.0000 mg | ORAL_TABLET | Freq: Once | ORAL | Status: AC
  Administered 2018-11-18: 60 mg via ORAL
  Filled 2018-11-18: qty 3

## 2018-11-18 MED ORDER — PREDNISONE 20 MG PO TABS: 40.0000 mg | ORAL_TABLET | Freq: Every day | ORAL | 0 refills | Status: DC

## 2018-11-18 MED ORDER — SODIUM CHLORIDE 0.9 % IV SOLN
Freq: Once | INTRAVENOUS | Status: AC
  Administered 2018-11-18: 01:00:00 via INTRAVENOUS

## 2018-11-18 MED ORDER — DIPHENOXYLATE-ATROPINE 2.5-0.025 MG PO TABS: 2.0000 | ORAL_TABLET | Freq: Four times a day (QID) | ORAL | 0 refills | Status: DC | PRN

## 2018-11-18 MED ORDER — BENZONATATE 100 MG PO CAPS: 100.0000 mg | ORAL_CAPSULE | Freq: Three times a day (TID) | ORAL | 0 refills | Status: DC | PRN

## 2018-11-18 MED ORDER — ALBUTEROL SULFATE HFA 108 (90 BASE) MCG/ACT IN AERS
2.0000 | INHALATION_SPRAY | RESPIRATORY_TRACT | Status: DC | PRN
  Administered 2018-11-18: 2 via RESPIRATORY_TRACT
  Filled 2018-11-18: qty 6.7

## 2018-11-18 NOTE — ED Provider Notes (Signed)
[facility] EMERGENCY DEPARTMENT Provider Note   CSN: [id number] Arrival date & time: 11/17/18  2020     History   Chief Complaint Chief Complaint  Patient presents with  . Cough  . Diarrhea    HPI Cody Weiss is a 47 y.o. male.  Patient presents to the emergency department for evaluation of flulike illness.  Patient reports that symptoms have been present for 2 weeks.  He has had cough, sneezing, nasal and sinus congestion, chest congestion.  He has had associated diarrhea.  Patient saw his primary doctor more than a week ago.  He was given a Z-Pak.  He has not had any improvement, in fact symptoms have worsened.  Diarrhea did start before he took the Z-Pak, but has worsened.  Patient reports that cough happens in spasms causes him to have trouble catching his breath and breathing.  He reports that he did have a similar illness in the past and it ended up being pneumonia.     Past Medical History:  Diagnosis Date  . Bronchitis   . Hypertension   . Pneumonia 10/2016    Patient Active Problem List   Diagnosis Date Noted  . CAP (community acquired pneumonia) 11/14/2016  . Acute bronchitis 11/14/2016    Past Surgical History:  Procedure Laterality Date  . HAND SURGERY    . HERNIA REPAIR          Home Medications    Prior to Admission medications   Medication Sig Start Date End Date Taking? Authorizing Provider  benzonatate (TESSALON) 100 MG capsule Take 1 capsule (100 mg total) by mouth 3 (three) times daily as needed for cough. 11/18/18   Gilda Crease, MD  diphenoxylate-atropine (LOMOTIL) 2.5-0.025 MG tablet Take 2 tablets by mouth 4 (four) times daily as needed for diarrhea or loose stools. 11/18/18   Arcenia Scarbro, Canary Brim, MD  HYDROcodone-homatropine (HYCODAN) 5-1.5 MG/5ML syrup Take 5 mLs by mouth every 6 (six) hours as needed for cough. 11/18/18   Gilda Crease, MD  ibuprofen (ADVIL,MOTRIN) 200 MG tablet Take 400 mg by mouth  every 6 (six) hours as needed for pain.    [provider]  predniSONE (DELTASONE) 20 MG tablet Take 2 tablets (40 mg total) by mouth daily with breakfast. 11/18/18   Antawn Sison, Canary Brim, MD    Family History No family history on file.  Social History Social History   Tobacco Use  . Smoking status: Never Smoker  . Smokeless tobacco: Current User    Types: Snuff  Substance Use Topics  . Alcohol use: Yes    Comment: occ  . Drug use: No     Allergies   Patient has no known allergies.   Review of Systems Review of Systems  HENT: Positive for congestion.   Respiratory: Positive for cough and shortness of breath.   Gastrointestinal: Positive for diarrhea. Negative for abdominal pain.  All other systems reviewed and are negative.    Physical Exam Updated Vital Signs BP 135/85   Pulse 63   Temp 98.5 F (36.9 C) (Oral)   Resp 17   Ht 5' 4.5" (1.638 m)   Wt 89.8 kg   SpO2 98%   BMI 33.46 kg/m   Physical Exam  Constitutional: He is oriented to person, place, and time. He appears well-developed and well-nourished. No distress.  HENT:  Head: Normocephalic and atraumatic.  Right Ear: Hearing normal.  Left Ear: Hearing normal.  Nose: Nose normal.  Mouth/Throat: Oropharynx  is clear and moist and mucous membranes are normal.  Eyes: Pupils are equal, round, and reactive to light. Conjunctivae and EOM are normal.  Neck: Normal range of motion. Neck supple.  Cardiovascular: Regular rhythm, S1 normal and S2 normal. Exam reveals no gallop and no friction rub.  No murmur heard. Pulmonary/Chest: Effort normal and breath sounds normal. No respiratory distress. He exhibits no tenderness.  Abdominal: Soft. Normal appearance and bowel sounds are normal. There is no hepatosplenomegaly. There is no tenderness. There is no rebound, no guarding, no tenderness at McBurney's point and negative Murphy's sign. No hernia.  Musculoskeletal: Normal range of motion.  Neurological: He  is alert and oriented to person, place, and time. He has normal strength. No cranial nerve deficit or sensory deficit. Coordination normal. GCS eye subscore is 4. GCS verbal subscore is 5. GCS motor subscore is 6.  Skin: Skin is warm, dry and intact. No rash noted. No cyanosis.  Psychiatric: He has a normal mood and affect. His speech is normal and behavior is normal. Thought content normal.  Nursing note and vitals reviewed.    ED Treatments / Results  Labs (all labs ordered are listed, but only abnormal results are displayed) Labs Reviewed  LIPASE, BLOOD - Abnormal; Notable for the following components:      Result Value   Lipase 70 (*)    All other components within normal limits  COMPREHENSIVE METABOLIC PANEL - Abnormal; Notable for the following components:   Glucose, Bld 104 (*)    All other components within normal limits  CBC - Abnormal; Notable for the following components:   Hemoglobin 17.2 (*)    HCT 52.2 (*)    All other components within normal limits  C DIFFICILE QUICK SCREEN W PCR REFLEX  URINALYSIS, ROUTINE W REFLEX MICROSCOPIC    EKG None  Radiology Dg Chest 2 View  Result Date: 11/18/2018 CLINICAL DATA:  Cough, shortness of breath EXAM: CHEST - 2 VIEW COMPARISON:  08/05/2017 FINDINGS: Heart and mediastinal contours are within normal limits. No focal opacities or effusions. No acute bony abnormality. IMPRESSION: No active cardiopulmonary disease. Electronically Signed   By: Charlett Nose M.D.   On: 11/18/2018 01:51    Procedures Procedures (including critical care time)  Medications Ordered in ED Medications  albuterol (PROVENTIL HFA;VENTOLIN HFA) 108 (90 Base) MCG/ACT inhaler 2 puff (has no administration in time range)  predniSONE (DELTASONE) tablet 60 mg (has no administration in time range)  0.9 %  sodium chloride infusion ( Intravenous Stopped 11/18/18 0222)     Initial Impression / Assessment and Plan / ED Course  I have reviewed the triage vital  signs and the nursing notes.  Pertinent labs & imaging results that were available during my care of the patient were reviewed by me and considered in my medical decision making (see chart for details).     Patient presents to the ER for evaluation of flulike symptoms that have been present for 2 weeks.  Patient has had cough, URI symptoms as well as diarrhea.  His primary care doctor gave him a Z-Pak but he did not improve.  She does have previous history of severe bronchitis and pneumonia requiring hospitalization.  Chest x-ray today does not show evidence of pneumonia.  Lab work was unremarkable, vital signs are unremarkable.  He is not hypoxic.  He does not appear toxic or ill.  Patient administered IV fluids because of the diarrhea.  He does not appear to be significantly dehydrated.  Will not test for flu because patient has been sick for more than a week, Tamiflu would not be helpful.  Most likely a viral illness requiring only supportive care.  Patient has not had any further diarrhea over the time he has been here in the ER, if diarrhea persists can have C. difficile testing performed as an outpatient.  Since diarrhea started before the antibiotic, doubt C. difficile.  Final Clinical Impressions(s) / ED Diagnoses   Final diagnoses:  Bronchitis    ED Discharge Orders         Ordered    diphenoxylate-atropine (LOMOTIL) 2.5-0.025 MG tablet  4 times daily PRN     11/18/18 0334    predniSONE (DELTASONE) 20 MG tablet  Daily with breakfast     11/18/18 0334    HYDROcodone-homatropine (HYCODAN) 5-1.5 MG/5ML syrup  Every 6 hours PRN     11/18/18 0334    benzonatate (TESSALON) 100 MG capsule  3 times daily PRN     11/18/18 0334           Gilda CreasePollina, Eliyana Pagliaro J, MD 11/18/18 [phone]

## 2018-11-18 NOTE — ED Notes (Signed)
Discharged with wife, ambulatory and feeling better

## 2019-12-21 IMAGING — CR DG CHEST 2V
2 series · 2 of 2 positions shown · non-contrast
Comparison: 08/05/2017

CLINICAL DATA: Cough, shortness of breath

EXAM:
CHEST - 2 VIEW

[chest pa]
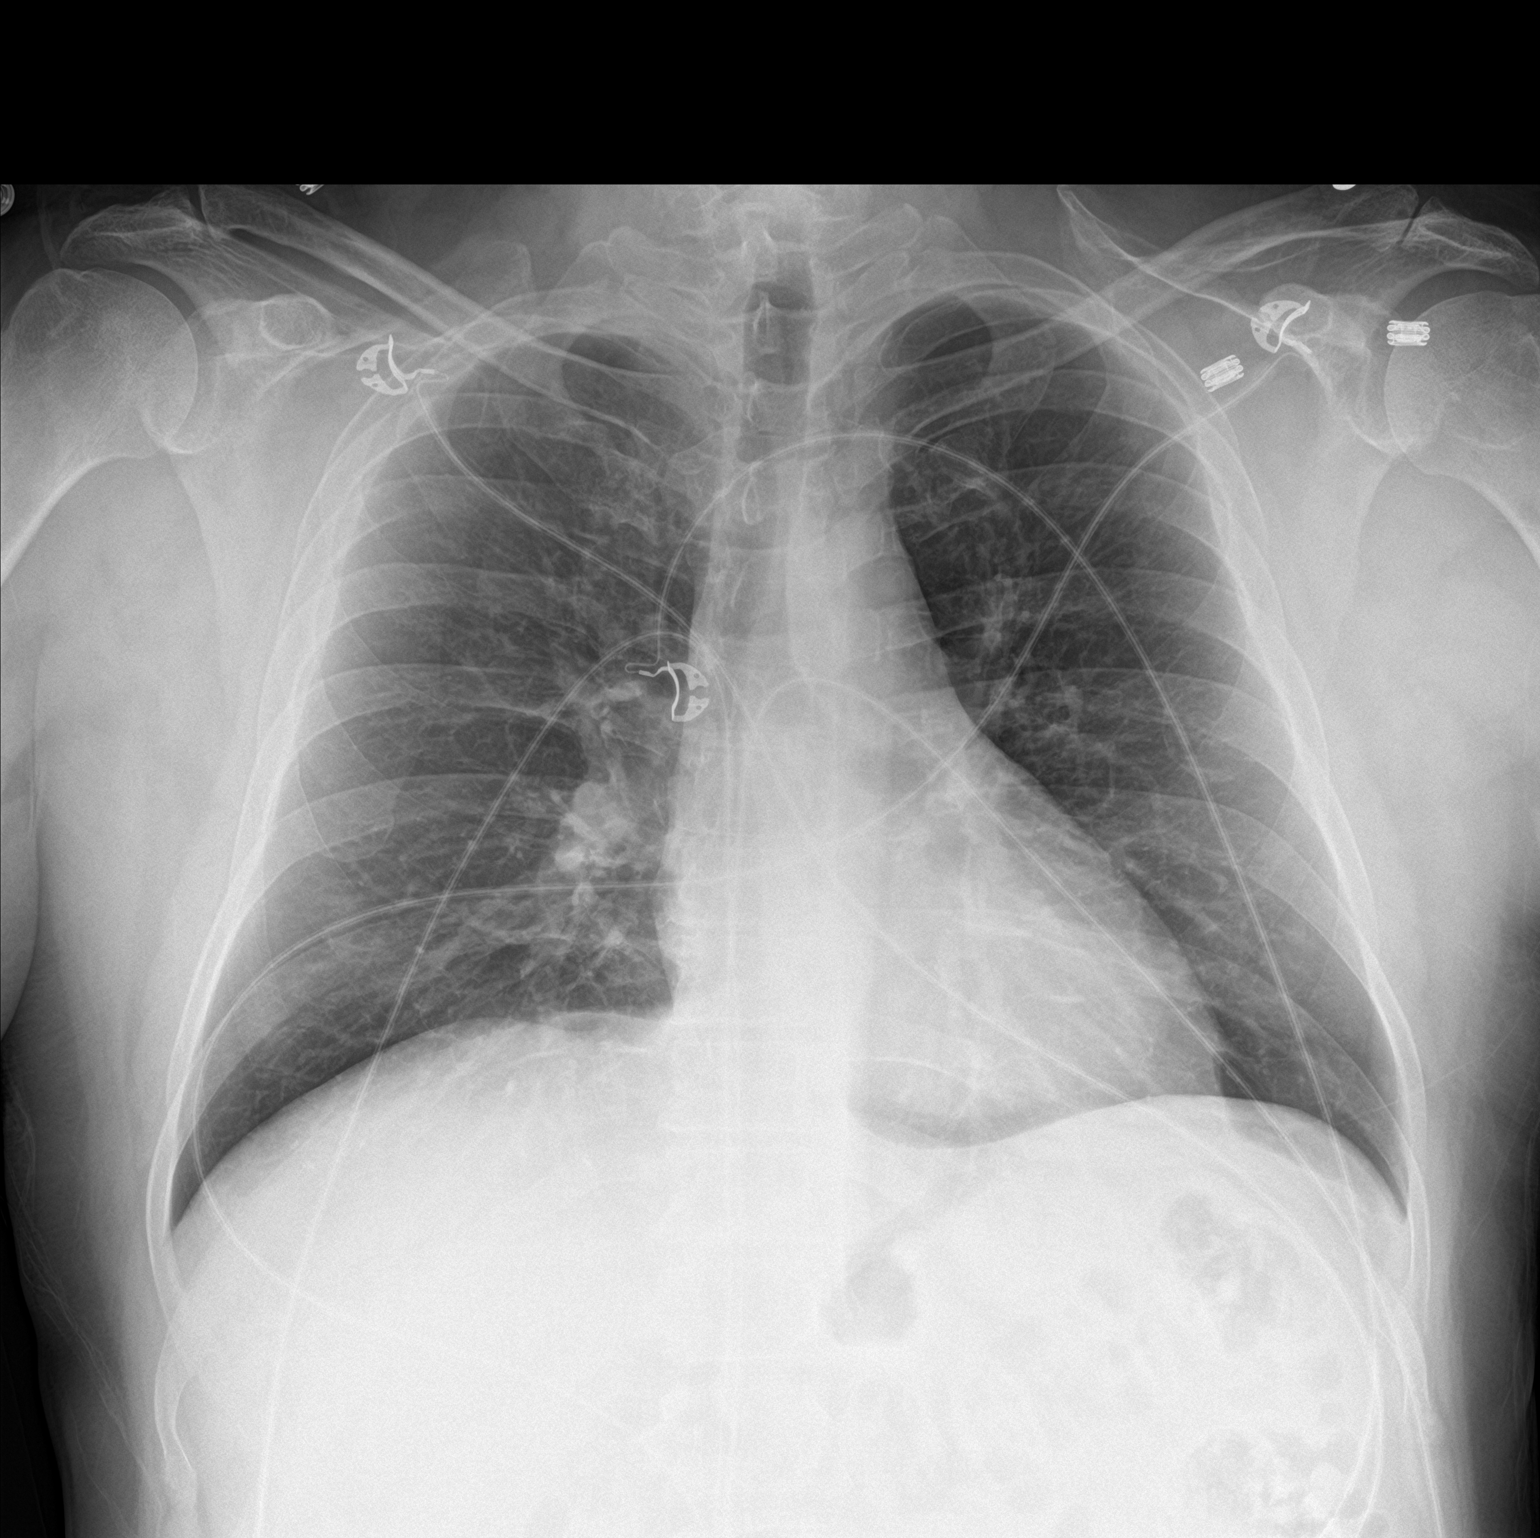

[chest lat]
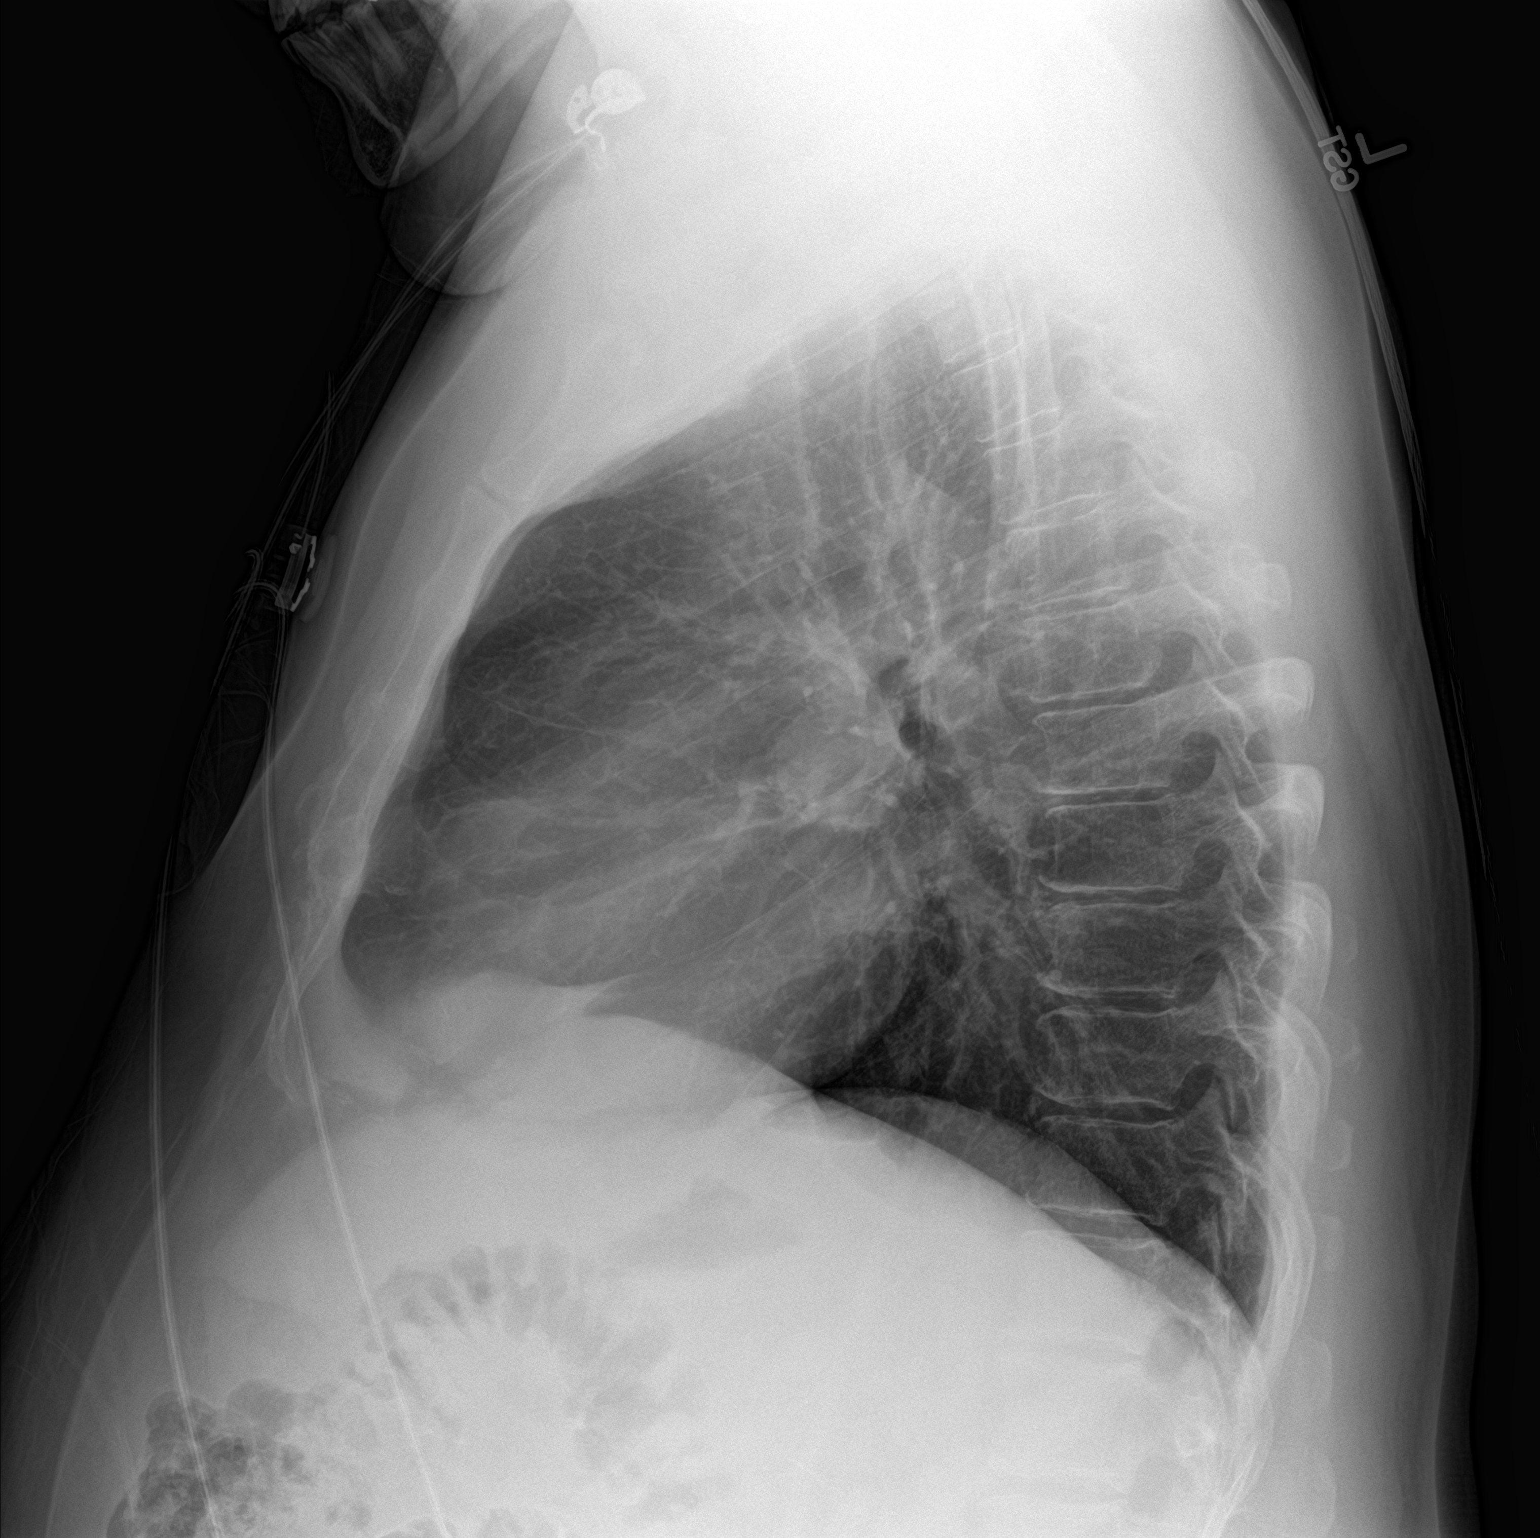

[2 of 2 positions shown; findings below may reference images not displayed]

FINDINGS: Heart and mediastinal contours are within normal limits. No focal
opacities or effusions. No acute bony abnormality.
IMPRESSION: No active cardiopulmonary disease.

## 2020-03-17 ENCOUNTER — Inpatient Hospital Stay (HOSPITAL_COMMUNITY)
Admission: EM | Admit: 2020-03-17 | Discharge: 2020-03-22 | DRG: 493 | Disposition: A | Discharge status: Home / Self Care | Attending: Student | Admitting: Student

## 2020-03-17 ENCOUNTER — Emergency Department (HOSPITAL_COMMUNITY): Payer: Worker's Compensation

## 2020-03-17 ENCOUNTER — Encounter (HOSPITAL_COMMUNITY): Payer: Self-pay

## 2020-03-17 ENCOUNTER — Emergency Department (HOSPITAL_COMMUNITY)

## 2020-03-17 ENCOUNTER — Other Ambulatory Visit: Payer: Self-pay

## 2020-03-17 ENCOUNTER — Emergency Department (HOSPITAL_COMMUNITY): Payer: Worker's Compensation | Attending: Emergency Medicine

## 2020-03-17 DIAGNOSIS — S82262A Displaced segmental fracture of shaft of left tibia, initial encounter for closed fracture: Secondary | ICD-10-CM | POA: Diagnosis present

## 2020-03-17 DIAGNOSIS — F17290 Nicotine dependence, other tobacco product, uncomplicated: Secondary | ICD-10-CM | POA: Diagnosis present

## 2020-03-17 DIAGNOSIS — Z79899 Other long term (current) drug therapy: Secondary | ICD-10-CM | POA: Diagnosis not present

## 2020-03-17 DIAGNOSIS — S82202A Unspecified fracture of shaft of left tibia, initial encounter for closed fracture: Secondary | ICD-10-CM

## 2020-03-17 DIAGNOSIS — S93432A Sprain of tibiofibular ligament of left ankle, initial encounter: Secondary | ICD-10-CM | POA: Diagnosis present

## 2020-03-17 DIAGNOSIS — Z20822 Contact with and (suspected) exposure to covid-19: Secondary | ICD-10-CM | POA: Diagnosis present

## 2020-03-17 DIAGNOSIS — S82462A Displaced segmental fracture of shaft of left fibula, initial encounter for closed fracture: Secondary | ICD-10-CM | POA: Diagnosis present

## 2020-03-17 DIAGNOSIS — W3189XA Contact with other specified machinery, initial encounter: Secondary | ICD-10-CM

## 2020-03-17 DIAGNOSIS — E559 Vitamin D deficiency, unspecified: Secondary | ICD-10-CM | POA: Diagnosis present

## 2020-03-17 DIAGNOSIS — T148XXA Other injury of unspecified body region, initial encounter: Secondary | ICD-10-CM

## 2020-03-17 DIAGNOSIS — S82892A Other fracture of left lower leg, initial encounter for closed fracture: Secondary | ICD-10-CM

## 2020-03-17 DIAGNOSIS — D62 Acute posthemorrhagic anemia: Secondary | ICD-10-CM | POA: Diagnosis not present

## 2020-03-17 DIAGNOSIS — Y990 Civilian activity done for income or pay: Secondary | ICD-10-CM | POA: Diagnosis not present

## 2020-03-17 DIAGNOSIS — I10 Essential (primary) hypertension: Secondary | ICD-10-CM | POA: Diagnosis present

## 2020-03-17 DIAGNOSIS — R52 Pain, unspecified: Secondary | ICD-10-CM

## 2020-03-17 DIAGNOSIS — T1490XA Injury, unspecified, initial encounter: Secondary | ICD-10-CM

## 2020-03-17 DIAGNOSIS — Y9289 Other specified places as the place of occurrence of the external cause: Secondary | ICD-10-CM

## 2020-03-17 DIAGNOSIS — S8782XA Crushing injury of left lower leg, initial encounter: Secondary | ICD-10-CM | POA: Diagnosis present

## 2020-03-17 LAB — BASIC METABOLIC PANEL
Anion gap: 12 (ref 5–15)
BUN: 7 mg/dL (ref 6–20)
CO2: 24 mmol/L (ref 22–32)
Calcium: 9.2 mg/dL (ref 8.9–10.3)
Chloride: 104 mmol/L (ref 98–111)
Creatinine, Ser: 0.95 mg/dL (ref 0.61–1.24)
GFR calc Af Amer: >60 mL/min (ref >60)
GFR calc non Af Amer: >60 mL/min (ref >60)
Glucose, Bld: 184 mg/dL — ABNORMAL HIGH (ref 70–99)
Potassium: 4.1 mmol/L (ref 3.5–5.1)
Sodium: 140 mmol/L (ref 135–145)

## 2020-03-17 LAB — RESPIRATORY PANEL BY RT PCR (FLU A&B, COVID)
Influenza A by PCR: NEGATIVE
Influenza B by PCR: NEGATIVE
SARS Coronavirus 2 by RT PCR: NEGATIVE

## 2020-03-17 MED ORDER — ACETAMINOPHEN 500 MG PO TABS
500.0000 mg | ORAL_TABLET | Freq: Four times a day (QID) | ORAL | Status: AC
  Administered 2020-03-18 (×2): 500 mg via ORAL
  Filled 2020-03-17 (×3): qty 1

## 2020-03-17 MED ORDER — METOPROLOL SUCCINATE ER 25 MG PO TB24: 25.0000 mg | ORAL_TABLET | Freq: Two times a day (BID) | ORAL | Status: DC

## 2020-03-17 MED ORDER — PROPOFOL 10 MG/ML IV BOLUS
INTRAVENOUS | Status: AC | PRN
  Administered 2020-03-17: 30 mg via INTRAVENOUS

## 2020-03-17 MED ORDER — ONDANSETRON HCL 4 MG/2ML IJ SOLN: 4.0000 mg | Freq: Four times a day (QID) | INTRAMUSCULAR | Status: DC | PRN

## 2020-03-17 MED ORDER — KETAMINE HCL 10 MG/ML IJ SOLN
INTRAMUSCULAR | Status: AC | PRN
  Administered 2020-03-17: 20 mg via INTRAVENOUS

## 2020-03-17 MED ORDER — ALBUTEROL SULFATE HFA 108 (90 BASE) MCG/ACT IN AERS: 1.0000 | INHALATION_SPRAY | Freq: Four times a day (QID) | RESPIRATORY_TRACT | Status: DC | PRN

## 2020-03-17 MED ORDER — HYDROCHLOROTHIAZIDE 12.5 MG PO CAPS: 12.5000 mg | ORAL_CAPSULE | Freq: Every day | ORAL | Status: DC

## 2020-03-17 MED ORDER — ONDANSETRON HCL 4 MG PO TABS: 4.0000 mg | ORAL_TABLET | Freq: Four times a day (QID) | ORAL | Status: DC | PRN

## 2020-03-17 MED ORDER — HYDROMORPHONE HCL 1 MG/ML IJ SOLN
1.0000 mg | Freq: Once | INTRAMUSCULAR | Status: AC
  Administered 2020-03-18: 1 mg via INTRAVENOUS
  Filled 2020-03-17: qty 1

## 2020-03-17 MED ORDER — HYDROCHLOROTHIAZIDE 12.5 MG PO CAPS
12.5000 mg | ORAL_CAPSULE | Freq: Every day | ORAL | Status: DC
  Administered 2020-03-19 – 2020-03-22 (×4): 12.5 mg via ORAL
  Filled 2020-03-17 (×4): qty 1

## 2020-03-17 MED ORDER — METHOCARBAMOL 1000 MG/10ML IJ SOLN: 500.0000 mg | Freq: Four times a day (QID) | INTRAVENOUS | Status: DC | PRN

## 2020-03-17 MED ORDER — PROPOFOL 10 MG/ML IV BOLUS
0.5000 mg/kg | Freq: Once | INTRAVENOUS | Status: DC
  Filled 2020-03-17: qty 20

## 2020-03-17 MED ORDER — KETAMINE HCL 50 MG/5ML IJ SOSY
1.0000 mg/kg | PREFILLED_SYRINGE | Freq: Once | INTRAMUSCULAR | Status: DC
  Filled 2020-03-17: qty 10

## 2020-03-17 MED ORDER — METHOCARBAMOL 500 MG PO TABS
500.0000 mg | ORAL_TABLET | Freq: Four times a day (QID) | ORAL | Status: DC | PRN
  Administered 2020-03-18 – 2020-03-19 (×2): 500 mg via ORAL
  Filled 2020-03-17 (×2): qty 1

## 2020-03-17 MED ORDER — PROPOFOL 10 MG/ML IV BOLUS
INTRAVENOUS | Status: AC | PRN
  Administered 2020-03-17: 20 mg via INTRAVENOUS

## 2020-03-17 MED ORDER — KETOROLAC TROMETHAMINE 15 MG/ML IJ SOLN
15.0000 mg | Freq: Once | INTRAMUSCULAR | Status: AC
  Administered 2020-03-18: 15 mg via INTRAVENOUS
  Filled 2020-03-17: qty 1

## 2020-03-17 MED ORDER — ONDANSETRON HCL 4 MG/2ML IJ SOLN
4.0000 mg | Freq: Once | INTRAMUSCULAR | Status: AC
  Administered 2020-03-17: 4 mg via INTRAVENOUS
  Filled 2020-03-17: qty 2

## 2020-03-17 MED ORDER — TETANUS-DIPHTH-ACELL PERTUSSIS 5-2.5-18.5 LF-MCG/0.5 IM SUSP
0.5000 mL | Freq: Once | INTRAMUSCULAR | Status: AC
  Administered 2020-03-17: 0.5 mL via INTRAMUSCULAR
  Filled 2020-03-17: qty 0.5

## 2020-03-17 MED ORDER — HYDROCODONE-ACETAMINOPHEN 7.5-325 MG PO TABS
1.0000 | ORAL_TABLET | ORAL | Status: DC | PRN
  Administered 2020-03-19 – 2020-03-20 (×3): 2 via ORAL
  Filled 2020-03-17 (×3): qty 2

## 2020-03-17 MED ORDER — MORPHINE SULFATE (PF) 4 MG/ML IV SOLN
8.0000 mg | Freq: Once | INTRAVENOUS | Status: AC
  Administered 2020-03-17: 8 mg via INTRAVENOUS
  Filled 2020-03-17: qty 2

## 2020-03-17 MED ORDER — MORPHINE SULFATE (PF) 2 MG/ML IV SOLN
2.0000 mg | INTRAVENOUS | Status: DC | PRN
  Administered 2020-03-19 – 2020-03-20 (×2): 2 mg via INTRAVENOUS
  Filled 2020-03-17 (×2): qty 1

## 2020-03-17 MED ORDER — HYDROCODONE-ACETAMINOPHEN 5-325 MG PO TABS
1.0000 | ORAL_TABLET | ORAL | Status: DC | PRN
  Administered 2020-03-17 – 2020-03-20 (×6): 2 via ORAL
  Administered 2020-03-21: 1 via ORAL
  Administered 2020-03-21 – 2020-03-22 (×5): 2 via ORAL
  Filled 2020-03-17 (×9): qty 2
  Filled 2020-03-17: qty 1
  Filled 2020-03-17 (×2): qty 2

## 2020-03-17 MED ORDER — METOPROLOL SUCCINATE ER 25 MG PO TB24
25.0000 mg | ORAL_TABLET | Freq: Every day | ORAL | Status: DC
  Administered 2020-03-18 – 2020-03-22 (×5): 25 mg via ORAL
  Filled 2020-03-17 (×5): qty 1

## 2020-03-17 MED ORDER — ACETAMINOPHEN 325 MG PO TABS: 325.0000 mg | ORAL_TABLET | Freq: Four times a day (QID) | ORAL | Status: DC | PRN

## 2020-03-17 MED ORDER — KETAMINE HCL 10 MG/ML IJ SOLN
INTRAMUSCULAR | Status: AC | PRN
  Administered 2020-03-17: 30 mg via INTRAVENOUS

## 2020-03-17 MED ORDER — ALBUTEROL SULFATE HFA 108 (90 BASE) MCG/ACT IN AERS
2.0000 | INHALATION_SPRAY | Freq: Four times a day (QID) | RESPIRATORY_TRACT | Status: DC | PRN
  Filled 2020-03-17: qty 6.7
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
## 2020-03-17 NOTE — ED Notes (Signed)
[phone] Please contact for workers Agricultural consultant

## 2020-03-17 NOTE — ED Provider Notes (Signed)
[facility] EMER[facility]ENCY [facility] Provider Note   CSN: [id number] Arrival date & time: 03/17/20  1642     History No chief complaint on file.   Cody LanRandy D Weiss is a 49 y.o. male.      49 year old male with crush injury to left lower extremity.  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  Pain and deformity at the distal leg/ankle.  He denies any other injuries.  Received morphine and ketamine in route by EMS and he was splinted.   Past Medical History:  Diagnosis Date  . Bronchitis   . Hypertension   . Pneumonia 10/2016    Patient Active Problem List   Diagnosis Date Noted  . CAP (community acquired pneumonia) 11/14/2016  . Acute bronchitis 11/14/2016    Past Surgical History:  Procedure Laterality Date  . HAND SURGERY    . HERNIA REPAIR         History reviewed. No pertinent family history.  Social History   Tobacco Use  . Smoking status: Never Smoker  . Smokeless tobacco: Current User    Types: Snuff  Substance Use Topics  . Alcohol use: Yes    Comment: occ  . Drug use: No    Home Medications Prior to Admission medications   Medication Sig Start Date End Date Taking? Authorizing Provider  benzonatate (TESSALON) 100 MG capsule Take 1 capsule (100 mg total) by mouth 3 (three) times daily as needed for cough. 11/18/18   Gilda CreasePollina, Christopher J, MD  diphenoxylate-atropine (LOMOTIL) 2.5-0.025 MG tablet Take 2 tablets by mouth 4 (four) times daily as needed for diarrhea or loose stools. 11/18/18   Pollina, Canary Brimhristopher J, MD  HYDROcodone-homatropine (HYCODAN) 5-1.5 MG/5ML syrup Take 5 mLs by mouth every 6 (six) hours as needed for cough. 11/18/18   Gilda CreasePollina, Christopher J, MD  ibuprofen (ADVIL,MOTRIN) 200 MG tablet Take 400 mg by mouth every 6 (six) hours as needed for pain.    [provider]  predniSONE (DELTASONE) 20 MG tablet Take 2 tablets (40 mg total) by mouth daily with breakfast. 11/18/18   Pollina,  Canary Brimhristopher J, MD    Allergies    Patient has no known allergies.  Review of Systems   Review of Systems All systems reviewed and negative, other than as noted in HPI.  Physical Exam Updated Vital Signs BP (!) 161/108   Pulse 87   Resp (!) 21   SpO2 100%   Physical Exam Vitals and nursing note reviewed.  Constitutional:      General: He is not in acute distress.    Appearance: He is well-developed.  HENT:     Head: Normocephalic and atraumatic.  Eyes:     General:        Right eye: No discharge.        Left eye: No discharge.     Conjunctiva/sclera: Conjunctivae normal.  Cardiovascular:     Rate and Rhythm: Normal rate and regular rhythm.     Heart sounds: Normal heart sounds. No murmur. No friction rub. No gallop.   Pulmonary:     Effort: Pulmonary effort is normal. No respiratory distress.     Breath sounds: Normal breath sounds.  Abdominal:     General: There is no distension.     Palpations: Abdomen is soft.     Tenderness: There is no abdominal tenderness.  Musculoskeletal:        General: Swelling, tenderness and signs of injury present.  Cervical back: Neck supple.     Comments: Deformity of the left distal leg/ankle.  Some overlying abrasions but nothing that looks concerning for an open injury.  Is able to wiggle his toes.  He has a good DP pulse.  Sensation is intact to light touch.  Compartments are soft.  Skin:    General: Skin is warm and dry.  Neurological:     Mental Status: He is alert.  Psychiatric:        Behavior: Behavior normal.        Thought Content: Thought content normal.     ED Results / Procedures / Treatments   Labs (all labs ordered are listed, but only abnormal results are displayed) Labs Reviewed  SURGICAL PCR SCREEN - Abnormal; Notable for the following components:      Result Value   Staphylococcus aureus POSITIVE (*)    All other components within normal limits  BASIC METABOLIC PANEL - Abnormal; Notable for the  following components:   Glucose, Bld 184 (*)    All other components within normal limits  VITAMIN D 25 HYDROXY (VIT D DEFICIENCY, FRACTURES) - Abnormal; Notable for the following components:   Vit D, 25-Hydroxy 13.11 (*)    All other components within normal limits  BASIC METABOLIC PANEL - Abnormal; Notable for the following components:   Glucose, Bld 171 (*)    Calcium 8.2 (*)    All other components within normal limits  CBC - Abnormal; Notable for the following components:   RBC 3.78 (*)    Hemoglobin 12.2 (*)    HCT 36.3 (*)    All other components within normal limits  RESPIRATORY PANEL BY RT PCR (FLU A&B, COVID)  HIV ANTIBODY (ROUTINE TESTING W REFLEX)  CBC  CREATININE, SERUM  CBC WITH DIFFERENTIAL/PLATELET    EKG None  Radiology DG Tibia/Fibula Left  Result Date: 03/18/2020 CLINICAL DATA:  ORIF ankle fracture EXAM: LEFT TIBIA AND FIBULA - 2 VIEW; DG C-ARM 1-60 MIN COMPARISON:  Left ankle 03/17/2020 FINDINGS: Locking intramedullary rod in the tibia crossing the comminuted fracture. Satisfactory alignment of the fracture. Two fibular fractures. Displaced fracture distal fibula reduced. Nondisplaced fracture distal fibula at the ankle joint. IMPRESSION: Intramedullary rod fusion of distal tibial fracture. Electronically Signed   By: Marlan Palau M.D.   On: 03/18/2020 14:36   DG Tibia/Fibula Left  Result Date: 03/17/2020 CLINICAL DATA:  Crush injury EXAM: LEFT TIBIA AND FIBULA - 2 VIEW COMPARISON:  None. FINDINGS: Comminuted displaced fracture involving the mid to distal shaft of the tibia. Acute displaced and overriding fracture distal shaft of the fibula at the junction of the middle and distal thirds with additional mildly displaced fracture at the distal shaft/metaphysis of the fibula. Proximal tibia and fibula appear intact. Soft tissue calcification versus superficial foreign bodies at the proximal lower leg on the medial side. IMPRESSION: 1. Comminuted and displaced  fracture involving the mid to distal shaft of the tibia 2. Acute displaced and overriding distal fibular shaft fracture with additional mildly displaced lateral malleolar fracture. Electronically Signed   By: Jasmine Pang M.D.   On: 03/17/2020 18:18   DG Ankle Complete Left  Result Date: 03/18/2020 CLINICAL DATA:  ORIF EXAM: LEFT ANKLE COMPLETE - 3+ VIEW COMPARISON:  Left ankle yesterday FINDINGS: Locking intramedullary rod across the distal tibial fracture. Fracture alignment satisfactory. Fiber wire across the distal tibia and fibula with metal buttons. Plate and screw fixation of distal fibular fracture in satisfactory alignment. Second fracture of  the fibula above the plate with approximately 1 shaft width of anterior displacement. IMPRESSION: Locking intramedullary rod distal tibia. Plate fixation distal fibular fracture. Displaced fracture of the fibula above the plate with 1 shaft width of anterior displacement. Electronically Signed   By: Marlan Palau M.D.   On: 03/18/2020 14:39   DG Ankle Complete Left  Result Date: 03/17/2020 CLINICAL DATA:  Crush injury ran over by machinery EXAM: LEFT ANKLE COMPLETE - 3+ VIEW COMPARISON:  None. FINDINGS: Acute comminuted fracture involving the distal shaft of the tibia with close to 1 shaft diameter of lateral and 1/2 shaft diameter of anterior displacement of the distal most fracture fragment. 8.9 cm laterally displaced fracture fragment between the proximal and distal tibia. Acute fracture distal shaft of the fibula at the junction of the middle and distal thirds with slightly greater than 1 shaft diameter of lateral and about 1 shaft diameter posterior displacement of distal fracture fragment, with 2.8 cm of overriding. Additional fracture at the distal shaft/metaphysis of the fibula. Bone fragment or small foreign body within the lateral soft tissues adjacent to the distal fibula. Suspected foreign bodies projecting over calcaneus on lateral view.  IMPRESSION: 1. Acute comminuted and displaced fracture involving the mid to distal shaft of the tibia 2. Acute displaced and overriding fracture involving the distal shaft of the fibula with additional minimally displaced fracture at the distal shaft/metaphysis of the fibula. Soft tissue foreign body versus small osseous fragment superficial to the lateral malleolus at the site of fracture. 3. Suspected multiple foreign bodies projecting over calcaneus on lateral view. Electronically Signed   By: Jasmine Pang M.D.   On: 03/17/2020 18:13   CT ANKLE LEFT WO CONTRAST  Result Date: 03/17/2020 CLINICAL DATA:  Fracture, edema EXAM: CT OF THE LEFT ANKLE WITHOUT CONTRAST TECHNIQUE: Multidetector CT imaging of the left ankle was performed according to the standard protocol. Multiplanar CT image reconstructions were also generated. COMPARISON:  03/17/2020 FINDINGS: Bones/Joint/Cartilage There is a comminuted displaced distal tibial diaphyseal fracture. There is approximately 1 cm of lateral translation of the distal fracture fragment. The overriding distal fibular diaphyseal fracture seen on x-rays unchanged in position, with 1 shaft with lateral displacement of the distal fracture fragment. There is also a transverse fracture at the metaphysis of the distal fibula, essentially nondisplaced. Small bony fragment within the tibiofibular syndesmosis reference axial image 68 corresponds to a bone fragment from the fibular diaphyseal fracture site. There are no radiopaque foreign bodies within the soft tissues. There are no displaced fractures within the foot. Tibiotalar joint is well aligned.  The ankle mortise is intact. Ligaments Suboptimally assessed by CT. Muscles and Tendons No gross abnormalities on this unenhanced exam. Soft tissues Subcutaneous edema is seen within the medial aspect of the left lower leg adjacent to the tibial fracture site. Reconstructed images demonstrate no additional findings. IMPRESSION: 1.  Comminuted distal tibial diaphyseal fracture with 1 cm of lateral displacement of the distal fracture fragment. 2. Segmental distal fibular fracture. Comminuted fracture of the fibular diaphysis with 1 shaft with lateral displacement. Essentially nondisplaced transverse fracture through the fibular metaphysis. 3. Anatomic alignment of the ankle mortise. Electronically Signed   By: Sharlet Salina M.D.   On: 03/17/2020 20:03   DG Knee Complete 4 Views Left  Result Date: 03/17/2020 CLINICAL DATA:  Crush injury EXAM: LEFT KNEE - COMPLETE 4+ VIEW COMPARISON:  None. FINDINGS: No fracture or malalignment. Joint spaces are maintained. Suspected superficial foreign body medial soft tissues inferior to  the knee. IMPRESSION: 1. No acute osseous abnormality 2. Suspected superficial foreign bodies medial aspect of the proximal lower leg Electronically Signed   By: Donavan Foil M.D.   On: 03/17/2020 18:32   DG Tibia/Fibula Left Port  Result Date: 03/18/2020 CLINICAL DATA:  Postop exam left leg. EXAM: PORTABLE LEFT TIBIA AND FIBULA - 2 VIEW COMPARISON:  03/18/2020. FINDINGS: ORIF left tibia and fibula. Mid tibial and fibular fractures noted. Patient is in a splint. IMPRESSION: ORIF left tibia and fibula. Electronically Signed   By: Marcello Moores  Register   On: 03/18/2020 14:50   DG Ankle Left Port  Result Date: 03/17/2020 CLINICAL DATA:  Post reduction EXAM: PORTABLE LEFT ANKLE - 2 VIEW COMPARISON:  03/17/2020 FINDINGS: Interval casting limits bone detail. Comminuted fracture distal shaft of the tibia with decreased displacement. 7.8 cm posteriorly displaced butterfly fragment. Overall alignment is improved compared to the prior exam. Acute fracture involving distal shaft of the fibula with residual just under 1 shaft diameter lateral and about 1 shaft diameter anterior displacement of distal fracture fragment, displacement is decreased in the AP view. There is decreased overriding. Additional nondisplaced fracture involving  the distal shaft and metaphysis of the fibula without significant change in alignment. IMPRESSION: 1. Slight decreased displacement of the comminuted distal tibial shaft fracture 2. Decreased overriding of the distal fibular shaft fracture with residual displacement described above. Additional fibular malleolar fracture without significant change in alignment Electronically Signed   By: Donavan Foil M.D.   On: 03/17/2020 21:19   DG Foot Complete Left  Result Date: 03/17/2020 CLINICAL DATA:  Crush injury EXAM: LEFT FOOT - COMPLETE 3+ VIEW COMPARISON:  None. FINDINGS: No acute displaced fracture or malalignment within the digits of the foot. Incompletely visualized distal fibular fractures. Wound over the posterolateral aspect of the foot with punctate probable superficial foreign bodies. IMPRESSION: 1. No definite acute osseous abnormality at the foot 2. Incompletely visualized distal fibular fractures Electronically Signed   By: Donavan Foil M.D.   On: 03/17/2020 18:16   DG C-Arm 1-60 Min  Result Date: 03/18/2020 CLINICAL DATA:  ORIF ankle fracture EXAM: LEFT TIBIA AND FIBULA - 2 VIEW; DG C-ARM 1-60 MIN COMPARISON:  Left ankle 03/17/2020 FINDINGS: Locking intramedullary rod in the tibia crossing the comminuted fracture. Satisfactory alignment of the fracture. Two fibular fractures. Displaced fracture distal fibula reduced. Nondisplaced fracture distal fibula at the ankle joint. IMPRESSION: Intramedullary rod fusion of distal tibial fracture. Electronically Signed   By: Franchot Gallo M.D.   On: 03/18/2020 14:36    Procedures Procedures (including critical care time)  .Sedation  Date/Time: 03/17/2020 7:30 PM Performed by: Virgel Manifold, MD Authorized by: Virgel Manifold, MD   Consent:    Consent obtained:  Verbal and written   Consent given by:  Patient   Risks discussed:  Allergic reaction, dysrhythmia, inadequate sedation, nausea, prolonged hypoxia resulting in organ damage, prolonged sedation  necessitating reversal, respiratory compromise necessitating ventilatory assistance and intubation and vomiting   Alternatives discussed:  Analgesia without sedation, anxiolysis and regional anesthesia Universal protocol:    Procedure explained and questions answered to patient or proxy's satisfaction: yes     Relevant documents present and verified: yes     Test results available and properly labeled: yes     Imaging studies available: yes     Required blood products, implants, devices, and special equipment available: yes     Site/side marked: yes     Immediately prior to procedure a time out was  called: yes     Patient identity confirmation method:  Verbally with patient Indications:    Procedure necessitating sedation performed by:  Physician performing sedation Pre-sedation assessment:    Time since last food or drink:  2 pm   ASA classification: class 2 - patient with mild systemic disease     Neck mobility: normal     Mouth opening:  3 or more finger widths   Thyromental distance:  3 finger widths   Mallampati score:  II - soft palate, uvula, fauces visible   Pre-sedation assessments completed and reviewed: airway patency, cardiovascular function, hydration status, mental status, nausea/vomiting, pain level, respiratory function and temperature   Immediate pre-procedure details:    Reassessment: Patient reassessed immediately prior to procedure     Reviewed: vital signs, relevant labs/tests and NPO status     Verified: bag valve mask available, emergency equipment available, intubation equipment available, IV patency confirmed, oxygen available and suction available   Procedure details (see MAR for exact dosages):    Preoxygenation:  Nasal cannula   Sedation:  Propofol and ketamine   Intended level of sedation: deep   Intra-procedure monitoring:  Blood pressure monitoring, cardiac monitor, continuous pulse oximetry, frequent LOC assessments, frequent vital sign checks and  continuous capnometry   Intra-procedure events: none     Total Provider sedation time (minutes):  30 Post-procedure details:    Attendance: Constant attendance by certified staff until patient recovered     Recovery: Patient returned to pre-procedure baseline     Post-sedation assessments completed and reviewed: airway patency, cardiovascular function, hydration status, mental status, nausea/vomiting, pain level, respiratory function and temperature     Patient is stable for discharge or admission: yes     Patient tolerance:  Tolerated well, no immediate complications Reduction of fracture  Date/Time: 03/17/2020 7:30 PM Performed by: Raeford Razor, MD Authorized by: Raeford Razor, MD  Consent: Written consent obtained. Risks and benefits: risks, benefits and alternatives were discussed Consent given by: patient Patient understanding: patient states understanding of the procedure being performed Patient consent: the patient's understanding of the procedure matches consent given Procedure consent: procedure consent matches procedure scheduled Relevant documents: relevant documents present and verified Test results: test results available and properly labeled Imaging studies: imaging studies available Required items: required blood products, implants, devices, and special equipment available Patient identity confirmed: verbally with patient and arm band Time out: Immediately prior to procedure a "time out" was called to verify the correct patient, procedure, equipment, support staff and site/side marked as required. Local anesthesia used: no  Anesthesia: Local anesthesia used: no  Sedation: Patient sedated: yes Sedation type: moderate (conscious) sedation Analgesia: see MAR for details  Patient tolerance: patient tolerated the procedure well with no immediate complications     Medications Ordered in ED Medications  morphine 4 MG/ML injection 8 mg (has no administration in time  range)    ED Course  I have reviewed the triage vital signs and the nursing notes.  Pertinent labs & imaging results that were available during my care of the patient were reviewed by me and considered in my medical decision making (see chart for details).    MDM Rules/Calculators/A&P                      49 year old male with a crush injury to his left lower extremity.  Obvious deformity.  Closed injury.  Neurovascularly intact.    Compartments are soft.  Pain control.  We will  keep n.p.o. for now.  Imaging.  Anticipate orthopedic consultation.  Basic labs and Covid testing in case he will be admitted for operative repair.  Had lunch around 2 PM today.  Discussed with orthopedic surgery. To admit. ORIF tommorow.   Final Clinical Impression(s) / ED Diagnoses Final diagnoses:  Closed fracture of left tibia and fibula, initial encounter  Closed fracture of left ankle, initial encounter    Rx / DC Orders ED Discharge Orders    None       Raeford Razor, MD 03/19/20 1640

## 2020-03-17 NOTE — ED Triage Notes (Signed)
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   165/95 84pulse  98% ra

## 2020-03-17 NOTE — Anesthesia Preprocedure Evaluation (Addendum)
Anesthesia Evaluation  Patient identified by MRN, date of birth, ID band Patient awake    Reviewed: Allergy & Precautions, NPO status , Patient's Chart, lab work & pertinent test results, reviewed documented beta blocker date and time   History of Anesthesia Complications Negative for: history of anesthetic complications  Airway Mallampati: II  TM Distance: >3 FB Neck ROM: Full    Dental no notable dental hx.    Pulmonary neg pulmonary ROS,    Pulmonary exam normal        Cardiovascular hypertension, Pt. on home beta blockers and Pt. on medications Normal cardiovascular exam     Neuro/Psych negative neurological ROS  negative psych ROS   GI/Hepatic negative GI ROS, Neg liver ROS,   Endo/Other  negative endocrine ROS  Renal/GU negative Renal ROS  negative genitourinary   Musculoskeletal Fractured left tibia and ankle   Abdominal   Peds  Hematology negative hematology ROS (+)   Anesthesia Other Findings Day of surgery medications reviewed with patient.  Reproductive/Obstetrics negative OB ROS                            Anesthesia Physical Anesthesia Plan  ASA: II  Anesthesia Plan: General   Post-op Pain Management: GA combined w/ Regional for post-op pain   Induction: Intravenous  PONV Risk Score and Plan: Treatment may vary due to age or medical condition, Ondansetron, Dexamethasone and Midazolam  Airway Management Planned: Oral ETT  Additional Equipment: None  Intra-op Plan:   Post-operative Plan: Extubation in OR  Informed Consent: I have reviewed the patients History and Physical, chart, labs and discussed the procedure including the risks, benefits and alternatives for the proposed anesthesia with the patient or authorized representative who has indicated his/her understanding and acceptance.     Dental advisory given  Plan Discussed with: CRNA  Anesthesia Plan  Comments:       Anesthesia Quick Evaluation

## 2020-03-17 NOTE — Progress Notes (Signed)
Orthopedic Tech Progress Note Patient Details:  Cody Weiss 1971-06-08 [id number] Ankle Reduction  Ortho Devices Type of Ortho Device: Stirrup splint, Long leg splint Ortho Device/Splint Location: LLE Ortho Device/Splint Interventions: Application   Post Interventions Patient Tolerated: Well Instructions Provided: Care of device   [name] E Love Milbourne 03/17/2020, 11:59 PM

## 2020-03-18 ENCOUNTER — Inpatient Hospital Stay (HOSPITAL_COMMUNITY): Admitting: Anesthesiology

## 2020-03-18 ENCOUNTER — Encounter (HOSPITAL_COMMUNITY)
Admission: EM | Disposition: A | Discharge status: Home / Self Care | Payer: Self-pay | Source: Home / Self Care | Attending: Student

## 2020-03-18 ENCOUNTER — Inpatient Hospital Stay (HOSPITAL_COMMUNITY)

## 2020-03-18 ENCOUNTER — Encounter (HOSPITAL_COMMUNITY): Payer: Self-pay | Admitting: Orthopedic Surgery

## 2020-03-18 HISTORY — PX: ORIF ANKLE FRACTURE: SHX5408

## 2020-03-18 HISTORY — PX: TIBIA IM NAIL INSERTION: SHX2516

## 2020-03-18 LAB — SURGICAL PCR SCREEN
MRSA, PCR: NEGATIVE
Staphylococcus aureus: POSITIVE — AB

## 2020-03-18 LAB — CREATININE, SERUM
Creatinine, Ser: 0.77 mg/dL (ref 0.61–1.24)
GFR calc Af Amer: >60 mL/min (ref >60)
GFR calc non Af Amer: >60 mL/min (ref >60)

## 2020-03-18 LAB — CBC
HCT: 40.5 % (ref 39.0–52.0)
Hemoglobin: 13.6 g/dL (ref 13.0–17.0)
MCH: 31.9 pg (ref 26.0–34.0)
MCHC: 33.6 g/dL (ref 30.0–36.0)
MCV: 95.1 fL (ref 80.0–100.0)
Platelets: 157 10*3/uL (ref 150–400)
RBC: 4.26 MIL/uL (ref 4.22–5.81)
RDW: 15.5 % (ref 11.5–15.5)
WBC: 6.2 10*3/uL (ref 4.0–10.5)
nRBC: 0 % (ref 0.0–0.2)

## 2020-03-18 LAB — VITAMIN D 25 HYDROXY (VIT D DEFICIENCY, FRACTURES): Vit D, 25-Hydroxy: 13.11 ng/mL — ABNORMAL LOW (ref 30–100)

## 2020-03-18 LAB — HIV ANTIBODY (ROUTINE TESTING W REFLEX): HIV Screen 4th Generation wRfx: NONREACTIVE

## 2020-03-18 SURGERY — INSERTION, INTRAMEDULLARY ROD, TIBIA
Anesthesia: General | Site: Ankle | Laterality: Left

## 2020-03-18 MED ORDER — DOCUSATE SODIUM 100 MG PO CAPS
100.0000 mg | ORAL_CAPSULE | Freq: Two times a day (BID) | ORAL | Status: DC
  Administered 2020-03-18 – 2020-03-22 (×8): 100 mg via ORAL
  Filled 2020-03-18 (×8): qty 1

## 2020-03-18 MED ORDER — POLYETHYLENE GLYCOL 3350 17 G PO PACK: 17.0000 g | PACK | Freq: Every day | ORAL | Status: DC | PRN

## 2020-03-18 MED ORDER — FENTANYL CITRATE (PF) 100 MCG/2ML IJ SOLN
INTRAMUSCULAR | Status: AC
  Administered 2020-03-18: 10:00:00 50 ug via INTRAVENOUS
  Filled 2020-03-18: qty 2

## 2020-03-18 MED ORDER — OXYCODONE HCL 5 MG/5ML PO SOLN: 5.0000 mg | Freq: Once | ORAL | Status: DC | PRN

## 2020-03-18 MED ORDER — BUPIVACAINE-EPINEPHRINE (PF) 0.5% -1:200000 IJ SOLN
INTRAMUSCULAR | Status: DC | PRN
  Administered 2020-03-18: 10 mL via PERINEURAL
  Administered 2020-03-18: 20 mL via PERINEURAL

## 2020-03-18 MED ORDER — METOCLOPRAMIDE HCL 5 MG PO TABS: 5.0000 mg | ORAL_TABLET | Freq: Three times a day (TID) | ORAL | Status: DC | PRN

## 2020-03-18 MED ORDER — FENTANYL CITRATE (PF) 100 MCG/2ML IJ SOLN: 50.0000 ug | Freq: Once | INTRAMUSCULAR | Status: AC

## 2020-03-18 MED ORDER — FENTANYL CITRATE (PF) 100 MCG/2ML IJ SOLN: 25.0000 ug | INTRAMUSCULAR | Status: DC | PRN

## 2020-03-18 MED ORDER — PROPOFOL 10 MG/ML IV BOLUS
INTRAVENOUS | Status: DC | PRN
  Administered 2020-03-18: 170 mg via INTRAVENOUS

## 2020-03-18 MED ORDER — ONDANSETRON HCL 4 MG/2ML IJ SOLN: 4.0000 mg | Freq: Four times a day (QID) | INTRAMUSCULAR | Status: DC | PRN

## 2020-03-18 MED ORDER — ENOXAPARIN SODIUM 40 MG/0.4ML ~~LOC~~ SOLN
40.0000 mg | SUBCUTANEOUS | Status: DC
  Administered 2020-03-19 – 2020-03-22 (×4): 40 mg via SUBCUTANEOUS
  Filled 2020-03-18 (×4): qty 0.4

## 2020-03-18 MED ORDER — ALBUTEROL SULFATE (2.5 MG/3ML) 0.083% IN NEBU: 3.0000 mL | INHALATION_SOLUTION | Freq: Four times a day (QID) | RESPIRATORY_TRACT | Status: DC | PRN

## 2020-03-18 MED ORDER — DEXAMETHASONE SODIUM PHOSPHATE 10 MG/ML IJ SOLN
INTRAMUSCULAR | Status: DC | PRN
  Administered 2020-03-18: 10 mg via INTRAVENOUS

## 2020-03-18 MED ORDER — SODIUM CHLORIDE 0.9 % IV SOLN: INTRAVENOUS | Status: DC

## 2020-03-18 MED ORDER — SUGAMMADEX SODIUM 200 MG/2ML IV SOLN
INTRAVENOUS | Status: DC | PRN
  Administered 2020-03-18 (×2): 100 mg via INTRAVENOUS

## 2020-03-18 MED ORDER — ASPIRIN EC 325 MG PO TBEC: 325.0000 mg | DELAYED_RELEASE_TABLET | Freq: Two times a day (BID) | ORAL | 0 refills | Status: AC

## 2020-03-18 MED ORDER — PROPOFOL 10 MG/ML IV BOLUS
INTRAVENOUS | Status: AC
  Filled 2020-03-18: qty 20

## 2020-03-18 MED ORDER — PROMETHAZINE HCL 25 MG/ML IJ SOLN: 6.2500 mg | INTRAMUSCULAR | Status: DC | PRN

## 2020-03-18 MED ORDER — ROCURONIUM BROMIDE 50 MG/5ML IV SOSY
PREFILLED_SYRINGE | INTRAVENOUS | Status: DC | PRN
  Administered 2020-03-18: 20 mg via INTRAVENOUS
  Administered 2020-03-18: 60 mg via INTRAVENOUS

## 2020-03-18 MED ORDER — METOCLOPRAMIDE HCL 5 MG/ML IJ SOLN: 5.0000 mg | Freq: Three times a day (TID) | INTRAMUSCULAR | Status: DC | PRN

## 2020-03-18 MED ORDER — BUPIVACAINE HCL (PF) 0.5 % IJ SOLN
INTRAMUSCULAR | Status: AC
  Filled 2020-03-18: qty 30

## 2020-03-18 MED ORDER — LIDOCAINE 2% (20 MG/ML) 5 ML SYRINGE
INTRAMUSCULAR | Status: DC | PRN
  Administered 2020-03-18: 60 mg via INTRAVENOUS

## 2020-03-18 MED ORDER — MIDAZOLAM HCL 2 MG/2ML IJ SOLN
INTRAMUSCULAR | Status: AC
  Administered 2020-03-18: 1 mg via INTRAVENOUS
  Filled 2020-03-18: qty 2

## 2020-03-18 MED ORDER — ONDANSETRON HCL 4 MG/2ML IJ SOLN
INTRAMUSCULAR | Status: AC
  Filled 2020-03-18: qty 8

## 2020-03-18 MED ORDER — POVIDONE-IODINE 10 % EX SWAB: 2.0000 "application " | Freq: Once | CUTANEOUS | Status: DC

## 2020-03-18 MED ORDER — VANCOMYCIN HCL 1000 MG IV SOLR
INTRAVENOUS | Status: DC | PRN
  Administered 2020-03-18: 1000 mg

## 2020-03-18 MED ORDER — ACETAMINOPHEN 500 MG PO TABS
1000.0000 mg | ORAL_TABLET | Freq: Once | ORAL | Status: AC
  Administered 2020-03-18: 1000 mg via ORAL
  Filled 2020-03-18: qty 2

## 2020-03-18 MED ORDER — CEFAZOLIN SODIUM-DEXTROSE 2-4 GM/100ML-% IV SOLN
2.0000 g | Freq: Three times a day (TID) | INTRAVENOUS | Status: AC
  Administered 2020-03-18 – 2020-03-19 (×3): 2 g via INTRAVENOUS
  Filled 2020-03-18 (×3): qty 100

## 2020-03-18 MED ORDER — ONDANSETRON HCL 4 MG PO TABS: 4.0000 mg | ORAL_TABLET | Freq: Four times a day (QID) | ORAL | Status: DC | PRN

## 2020-03-18 MED ORDER — MIDAZOLAM HCL 2 MG/2ML IJ SOLN
INTRAMUSCULAR | Status: AC
  Filled 2020-03-18: qty 2

## 2020-03-18 MED ORDER — CHLORHEXIDINE GLUCONATE 4 % EX LIQD: 60.0000 mL | Freq: Once | CUTANEOUS | Status: DC

## 2020-03-18 MED ORDER — HYDROCODONE-ACETAMINOPHEN 7.5-325 MG PO TABS: 1.0000 | ORAL_TABLET | ORAL | 0 refills | Status: DC | PRN

## 2020-03-18 MED ORDER — BACITRACIN ZINC 500 UNIT/GM EX OINT
TOPICAL_OINTMENT | CUTANEOUS | Status: AC
  Filled 2020-03-18: qty 28.35

## 2020-03-18 MED ORDER — VANCOMYCIN HCL 1000 MG IV SOLR
INTRAVENOUS | Status: AC
  Filled 2020-03-18: qty 1000

## 2020-03-18 MED ORDER — MUPIROCIN 2 % EX OINT
1.0000 "application " | TOPICAL_OINTMENT | Freq: Two times a day (BID) | CUTANEOUS | Status: DC
  Administered 2020-03-18 – 2020-03-21 (×7): 1 via TOPICAL
  Filled 2020-03-18 (×2): qty 22

## 2020-03-18 MED ORDER — FENTANYL CITRATE (PF) 250 MCG/5ML IJ SOLN
INTRAMUSCULAR | Status: AC
  Filled 2020-03-18: qty 5

## 2020-03-18 MED ORDER — CEFAZOLIN SODIUM-DEXTROSE 2-4 GM/100ML-% IV SOLN
2.0000 g | INTRAVENOUS | Status: AC
  Administered 2020-03-18: 2 g via INTRAVENOUS
  Filled 2020-03-18: qty 100

## 2020-03-18 MED ORDER — OXYCODONE HCL 5 MG PO TABS: 5.0000 mg | ORAL_TABLET | Freq: Once | ORAL | Status: DC | PRN

## 2020-03-18 MED ORDER — DEXMEDETOMIDINE HCL IN NACL 200 MCG/50ML IV SOLN
INTRAVENOUS | Status: AC
  Filled 2020-03-18: qty 50

## 2020-03-18 MED ORDER — METHOCARBAMOL 500 MG PO TABS: 500.0000 mg | ORAL_TABLET | Freq: Four times a day (QID) | ORAL | 0 refills | Status: DC | PRN

## 2020-03-18 MED ORDER — MIDAZOLAM HCL 2 MG/2ML IJ SOLN: 1.0000 mg | Freq: Once | INTRAMUSCULAR | Status: AC

## 2020-03-18 MED ORDER — EPHEDRINE SULFATE-NACL 50-0.9 MG/10ML-% IV SOSY
PREFILLED_SYRINGE | INTRAVENOUS | Status: DC | PRN
  Administered 2020-03-18 (×2): 10 mg via INTRAVENOUS
  Administered 2020-03-18: 5 mg via INTRAVENOUS
  Administered 2020-03-18: 10 mg via INTRAVENOUS

## 2020-03-18 MED ORDER — LACTATED RINGERS IV SOLN: INTRAVENOUS | Status: DC

## 2020-03-18 MED ORDER — 0.9 % SODIUM CHLORIDE (POUR BTL) OPTIME
TOPICAL | Status: DC | PRN
  Administered 2020-03-18: 1000 mL

## 2020-03-18 MED ORDER — ONDANSETRON HCL 4 MG/2ML IJ SOLN
INTRAMUSCULAR | Status: DC | PRN
  Administered 2020-03-18: 4 mg via INTRAVENOUS

## 2020-03-18 MED ORDER — DEXAMETHASONE SODIUM PHOSPHATE 10 MG/ML IJ SOLN
INTRAMUSCULAR | Status: AC
  Filled 2020-03-18: qty 7

## 2020-03-18 MED ORDER — FENTANYL CITRATE (PF) 250 MCG/5ML IJ SOLN
INTRAMUSCULAR | Status: DC | PRN
  Administered 2020-03-18: 100 ug via INTRAVENOUS
  Administered 2020-03-18: 50 ug via INTRAVENOUS

## 2020-03-18 SURGICAL SUPPLY — 87 items
APL PRP STRL LF DISP 70% ISPRP (MISCELLANEOUS) ×2
BANDAGE ESMARK 6X9 LF (GAUZE/BANDAGES/DRESSINGS) ×2 IMPLANT
BIT DRILL 2.5X2.75 QC CALB (BIT) ×2 IMPLANT
BIT DRILL CALIBRATED 4.3X320MM (BIT) IMPLANT
BIT DRILL CROWE POINT TWST 4.3 (DRILL) IMPLANT
BLADE SURG 10 STRL SS (BLADE) ×8 IMPLANT
BNDG CMPR 9X6 STRL LF SNTH (GAUZE/BANDAGES/DRESSINGS) ×2
BNDG COHESIVE 4X5 TAN STRL (GAUZE/BANDAGES/DRESSINGS) ×4 IMPLANT
BNDG ELASTIC 4X5.8 VLCR STR LF (GAUZE/BANDAGES/DRESSINGS) ×4 IMPLANT
BNDG ELASTIC 6X5.8 VLCR STR LF (GAUZE/BANDAGES/DRESSINGS) ×4 IMPLANT
BNDG ESMARK 6X9 LF (GAUZE/BANDAGES/DRESSINGS) ×4
BNDG GAUZE ELAST 4 BULKY (GAUZE/BANDAGES/DRESSINGS) ×4 IMPLANT
BRUSH SCRUB EZ PLAIN DRY (MISCELLANEOUS) ×8 IMPLANT
CHLORAPREP W/TINT 26 (MISCELLANEOUS) ×4 IMPLANT
COVER SURGICAL LIGHT HANDLE (MISCELLANEOUS) ×8 IMPLANT
COVER WAND RF STERILE (DRAPES) ×4 IMPLANT
DRAPE C-ARM 42X72 X-RAY (DRAPES) ×4 IMPLANT
DRAPE C-ARMOR (DRAPES) ×4 IMPLANT
DRAPE HALF SHEET 40X57 (DRAPES) ×8 IMPLANT
DRAPE IMP U-DRAPE 54X76 (DRAPES) ×8 IMPLANT
DRAPE INCISE IOBAN 66X45 STRL (DRAPES) IMPLANT
DRAPE ORTHO SPLIT 77X108 STRL (DRAPES) ×8
DRAPE SURG ORHT 6 SPLT 77X108 (DRAPES) ×4 IMPLANT
DRAPE U-SHAPE 47X51 STRL (DRAPES) ×4 IMPLANT
DRILL CALIBRATED 4.3X320MM (BIT) ×4
DRILL CROWE POINT TWIST 4.3 (DRILL) ×8
DRSG ADAPTIC 3X8 NADH LF (GAUZE/BANDAGES/DRESSINGS) ×4 IMPLANT
DRSG MEPITEL 4X7.2 (GAUZE/BANDAGES/DRESSINGS) ×2 IMPLANT
ELECT REM PT RETURN 9FT ADLT (ELECTROSURGICAL) ×4
ELECTRODE REM PT RTRN 9FT ADLT (ELECTROSURGICAL) ×2 IMPLANT
FIXATION ZIPTIGHT ANKLE SNDSMS (Ankle) IMPLANT
GAUZE SPONGE 4X4 12PLY STRL (GAUZE/BANDAGES/DRESSINGS) ×6 IMPLANT
GLOVE BIO SURGEON STRL SZ 6.5 (GLOVE) ×9 IMPLANT
GLOVE BIO SURGEON STRL SZ7.5 (GLOVE) ×16 IMPLANT
GLOVE BIO SURGEONS STRL SZ 6.5 (GLOVE) ×3
GLOVE BIOGEL PI IND STRL 6.5 (GLOVE) ×2 IMPLANT
GLOVE BIOGEL PI IND STRL 7.5 (GLOVE) ×2 IMPLANT
GLOVE BIOGEL PI INDICATOR 6.5 (GLOVE) ×2
GLOVE BIOGEL PI INDICATOR 7.5 (GLOVE) ×2
GOWN STRL REUS W/ TWL LRG LVL3 (GOWN DISPOSABLE) ×4 IMPLANT
GOWN STRL REUS W/TWL LRG LVL3 (GOWN DISPOSABLE) ×8
GUIDEPIN 3.2X17.5 THRD DISP (PIN) ×2 IMPLANT
GUIDEWIRE 2.6X80 BEAD TIP (WIRE) IMPLANT
GUIDWIRE 2.6X80 BEAD TIP (WIRE) ×4
K-WIRE ACE 1.6X6 (WIRE) ×4
KIT BASIN OR (CUSTOM PROCEDURE TRAY) ×4 IMPLANT
KIT TURNOVER KIT B (KITS) ×4 IMPLANT
KWIRE ACE 1.6X6 (WIRE) IMPLANT
MANIFOLD NEPTUNE II (INSTRUMENTS) ×4 IMPLANT
NAIL TIBIAL PHOENIX 9.0X300MM (Nail) ×2 IMPLANT
NDL HYPO 21X1.5 SAFETY (NEEDLE) IMPLANT
NDL HYPO 25GX1X1/2 BEV (NEEDLE) ×2 IMPLANT
NEEDLE HYPO 21X1.5 SAFETY (NEEDLE) IMPLANT
NEEDLE HYPO 25GX1X1/2 BEV (NEEDLE) ×4 IMPLANT
NS IRRIG 1000ML POUR BTL (IV SOLUTION) ×4 IMPLANT
PACK TOTAL JOINT (CUSTOM PROCEDURE TRAY) ×4 IMPLANT
PAD ARMBOARD 7.5X6 YLW CONV (MISCELLANEOUS) ×8 IMPLANT
PAD CAST 4YDX4 CTTN HI CHSV (CAST SUPPLIES) IMPLANT
PADDING CAST COTTON 4X4 STRL (CAST SUPPLIES) ×4
PADDING CAST COTTON 6X4 STRL (CAST SUPPLIES) ×4 IMPLANT
PLATE TUB 100DEG 5 HO (Plate) ×2 IMPLANT
SCREW CORT TI DBL LEAD 5X32 (Screw) ×4 IMPLANT
SCREW CORT TI DBL LEAD 5X38 (Screw) ×2 IMPLANT
SCREW CORT TI DBL LEAD 5X42 (Screw) ×2 IMPLANT
SCREW CORTICAL 3.5MM  16MM (Screw) ×4 IMPLANT
SCREW CORTICAL 3.5MM  20MM (Screw) ×4 IMPLANT
SCREW CORTICAL 3.5MM 14MM (Screw) ×4 IMPLANT
SCREW CORTICAL 3.5MM 16MM (Screw) IMPLANT
SCREW CORTICAL 3.5MM 20MM (Screw) IMPLANT
SPONGE LAP 18X18 RF (DISPOSABLE) IMPLANT
STAPLER VISISTAT 35W (STAPLE) ×4 IMPLANT
SUCTION FRAZIER HANDLE 10FR (MISCELLANEOUS) ×4
SUCTION TUBE FRAZIER 10FR DISP (MISCELLANEOUS) ×2 IMPLANT
SUT ETHILON 3 0 PS 1 (SUTURE) ×8 IMPLANT
SUT MNCRL AB 3-0 PS2 18 (SUTURE) ×4 IMPLANT
SUT PROLENE 0 CT (SUTURE) IMPLANT
SUT VIC AB 0 CT1 27 (SUTURE) ×4
SUT VIC AB 0 CT1 27XBRD ANBCTR (SUTURE) ×2 IMPLANT
SUT VIC AB 2-0 CT1 27 (SUTURE) ×8
SUT VIC AB 2-0 CT1 TAPERPNT 27 (SUTURE) ×4 IMPLANT
SYR CONTROL 10ML LL (SYRINGE) ×4 IMPLANT
TOWEL GREEN STERILE (TOWEL DISPOSABLE) ×8 IMPLANT
TOWEL GREEN STERILE FF (TOWEL DISPOSABLE) ×4 IMPLANT
UNDERPAD 30X30 (UNDERPADS AND DIAPERS) ×4 IMPLANT
WATER STERILE IRR 1000ML POUR (IV SOLUTION) ×4 IMPLANT
YANKAUER SUCT BULB TIP NO VENT (SUCTIONS) IMPLANT
ZIPTIGHT ANKLE SYNODESMOSS FIX (Ankle) ×4 IMPLANT

## 2020-03-18 NOTE — Progress Notes (Signed)
Clinicals requested. Workmen Comp  Case: Chief of Staff, Attn: Investment banker, operational.  Fax [phone], claim # [id number]. [phone]. NCM faxed requested information. Gae Gallop RN,BSN,CM

## 2020-03-18 NOTE — Progress Notes (Signed)
Orthopedic Tech Progress Note Patient Details:  Cody Weiss 05-May-1971 [id number] I put up an Over Head Frame with Trapeze for patient Patient ID: [name], male   DOB: 12-16-1971, 49 y.o.   MRN: [id number]   [name] 03/18/2020, 4:53 PM

## 2020-03-18 NOTE — Transfer of Care (Signed)
Immediate Anesthesia Transfer of Care Note  Patient: Cody Weiss  Procedure(s) Performed: INTRAMEDULLARY (IM) NAIL TIBIAL (Left ) OPEN REDUCTION INTERNAL FIXATION (ORIF) ANKLE FRACTURE (Left Ankle)  Patient Location: PACU  Anesthesia Type:General  Level of Consciousness: drowsy and patient cooperative  Airway & Oxygen Therapy: Patient Spontanous Breathing and Patient connected to nasal cannula oxygen  Post-op Assessment: Report given to RN and Post -op Vital signs reviewed and stable  Post vital signs: Reviewed and stable  Last Vitals:  Vitals Value Taken Time  BP 115/83 03/18/20 1500  Temp 37.1 C 03/18/20 1500  Pulse 71 03/18/20 1505  Resp 17 03/18/20 1505  SpO2 97 % 03/18/20 1505  Vitals shown include unvalidated device data.  Last Pain:  Vitals:   03/18/20 1500  TempSrc:   PainSc: 0-No pain      Patients Stated Pain Goal: 0 (03/18/20 0519)  Complications: No apparent anesthesia complications
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
## 2020-03-18 NOTE — Anesthesia Procedure Notes (Signed)
Anesthesia Regional Block: Adductor canal block   Pre-Anesthetic Checklist: ,, timeout performed, Correct Patient, Correct Site, Correct Laterality, Correct Procedure, Correct Position, site marked, Risks and benefits discussed, pre-op evaluation,  At surgeon's request and post-op pain management  Laterality: Left  Prep: Maximum Sterile Barrier Precautions used, chloraprep       Needles:  Injection technique: Single-shot  Needle Type: Echogenic Stimulator Needle     Needle Length: 9cm  Needle Gauge: 22     Additional Needles:   Procedures:,,,, ultrasound used (permanent image in chart),,,,  Narrative:  Start time: 03/18/2020 10:27 AM End time: 03/18/2020 10:29 AM Injection made incrementally with aspirations every 5 mL.  Performed by: Personally  Anesthesiologist: Kaylyn Layer, MD  Additional Notes: Risks, benefits, and alternative discussed. Patient gave consent for procedure. Patient prepped and draped in sterile fashion. Sedation administered, patient remains easily responsive to voice. Relevant anatomy identified with ultrasound guidance. Local anesthetic given in 5cc increments with no signs or symptoms of intravascular injection. No pain or paraesthesias with injection. Patient monitored throughout procedure with signs of LAST or immediate complications. Tolerated well. Ultrasound image placed in chart.  Amalia Greenhouse, MD

## 2020-03-18 NOTE — Anesthesia Procedure Notes (Signed)
Anesthesia Regional Block: Popliteal block   Pre-Anesthetic Checklist: ,, timeout performed, Correct Patient, Correct Site, Correct Laterality, Correct Procedure, Correct Position, site marked, Risks and benefits discussed, pre-op evaluation,  At surgeon's request and post-op pain management  Laterality: Left  Prep: Maximum Sterile Barrier Precautions used, chloraprep       Needles:  Injection technique: Single-shot  Needle Type: Echogenic Stimulator Needle     Needle Length: 9cm  Needle Gauge: 22     Additional Needles:   Procedures:,,,, ultrasound used (permanent image in chart),,,,  Narrative:  Start time: 03/18/2020 11:44 AM End time: 03/18/2020 11:47 AM Injection made incrementally with aspirations every 5 mL.  Performed by: Personally  Anesthesiologist: Kaylyn Layer, MD  Additional Notes: Risks, benefits, and alternative discussed. Patient gave consent for procedure. Popliteal block done in OR under [facility] due to immobilizer bandage in place which had to be removed after induction. Patient prepped and draped in sterile fashion.  Relevant anatomy identified with ultrasound guidance. Local anesthetic given in 5cc increments with no signs or symptoms of intravascular injection. Patient monitored throughout procedure with signs of LAST or immediate complications. Tolerated well. Ultrasound image placed in chart.  Amalia Greenhouse, MD

## 2020-03-18 NOTE — Anesthesia Procedure Notes (Signed)
Procedure Name: Intubation Date/Time: 03/18/2020 11:41 AM Performed by: Shirlyn Goltz, CRNA Pre-anesthesia Checklist: Patient identified, Emergency Drugs available, Suction available and Patient being monitored Patient Re-evaluated:Patient Re-evaluated prior to induction Oxygen Delivery Method: Circle system utilized Preoxygenation: Pre-oxygenation with 100% oxygen Induction Type: IV induction Ventilation: Mask ventilation without difficulty and Oral airway inserted - appropriate to patient size Laryngoscope Size: Mac and 3 Grade View: Grade II Tube type: Oral Tube size: 7.5 mm Number of attempts: 1 Airway Equipment and Method: Stylet Placement Confirmation: ETT inserted through vocal cords under direct vision,  positive ETCO2 and breath sounds checked- equal and bilateral Secured at: 22 cm Tube secured with: Tape Dental Injury: Teeth and Oropharynx as per pre-operative assessment

## 2020-03-18 NOTE — Discharge Instructions (Signed)
Orthopaedic Trauma Service Discharge Instructions   General Discharge Instructions  WEIGHT BEARING STATUS: Non-weightbearing on left leg  RANGE OF MOTION/ACTIVITY: Okay for gentle knee motion  Wound Care: Leave splint in place until you follow-up with Dr. Jena Gauss. Okay to shower, keep splint dry.  DVT/PE prophylaxis: Aspirin 325 mg twice daily  Diet: as you were eating previously.  Can use over the counter stool softeners and bowel preparations, such as Miralax, to help with bowel movements.  Narcotics can be constipating.  Be sure to drink plenty of fluids  PAIN MEDICATION USE AND EXPECTATIONS  You have likely been given narcotic medications to help control your pain.  After a traumatic event that results in an fracture (broken bone) with or without surgery, it is ok to use narcotic pain medications to help control one's pain.  We understand that everyone responds to pain differently and each individual patient will be evaluated on a regular basis for the continued need for narcotic medications. Ideally, narcotic medication use should last no more than 6-8 weeks (coinciding with fracture healing).   As a patient it is your responsibility as well to monitor narcotic medication use and report the amount and frequency you use these medications when you come to your office visit.   We would also advise that if you are using narcotic medications, you should take a dose prior to therapy to maximize you participation.  IF YOU ARE ON NARCOTIC MEDICATIONS IT IS NOT PERMISSIBLE TO OPERATE A MOTOR VEHICLE (MOTORCYCLE/CAR/TRUCK/MOPED) OR HEAVY MACHINERY DO NOT MIX NARCOTICS WITH OTHER CNS (CENTRAL NERVOUS SYSTEM) DEPRESSANTS SUCH AS ALCOHOL   STOP SMOKING OR USING NICOTINE PRODUCTS!!!!  As discussed nicotine severely impairs your body's ability to heal surgical and traumatic wounds but also impairs bone healing.  Wounds and bone heal by forming microscopic blood vessels (angiogenesis) and nicotine  is a vasoconstrictor (essentially, shrinks blood vessels).  Therefore, if vasoconstriction occurs to these microscopic blood vessels they essentially disappear and are unable to deliver necessary nutrients to the healing tissue.  This is one modifiable factor that you can do to dramatically increase your chances of healing your injury.    (This means no smoking, no nicotine gum, patches, etc)  DO NOT USE NONSTEROIDAL ANTI-INFLAMMATORY DRUGS (NSAID'S)  Using products such as Advil (ibuprofen), Aleve (naproxen), Motrin (ibuprofen) for additional pain control during fracture healing can delay and/or prevent the healing response.  If you would like to take over the counter (OTC) medication, Tylenol (acetaminophen) is ok.  However, some narcotic medications that are given for pain control contain acetaminophen as well. Therefore, you should not exceed more than 4000 mg of tylenol in a day if you do not have liver disease.  Also note that there are may OTC medicines, such as cold medicines and allergy medicines that my contain tylenol as well.  If you have any questions about medications and/or interactions please ask your doctor/PA or your pharmacist.      ICE AND ELEVATE INJURED/OPERATIVE EXTREMITY  Using ice and elevating the injured extremity above your heart can help with swelling and pain control.  Icing in a pulsatile fashion, such as 20 minutes on and 20 minutes off, can be followed.    Do not place ice directly on skin. Make sure there is a barrier between to skin and the ice pack.    Using frozen items such as frozen peas works well as the conform nicely to the are that needs to be iced.  USE AN  ACE WRAP OR TED HOSE FOR SWELLING CONTROL  In addition to icing and elevation, Ace wraps or TED hose are used to help limit and resolve swelling.  It is recommended to use Ace wraps or TED hose until you are informed to stop.    When using Ace Wraps start the wrapping distally (farthest away from the body)  and wrap proximally (closer to the body)   Example: If you had surgery on your leg or thing and you do not have a splint on, start the ace wrap at the toes and work your way up to the thigh        If you had surgery on your upper extremity and do not have a splint on, start the ace wrap at your fingers and work your way up to the upper arm  IF YOU ARE IN A SPLINT OR CAST DO NOT [place]   If your splint gets wet for any reason please contact the office immediately. You may shower in your splint or cast as long as you keep it dry.  This can be done by wrapping in a cast cover or garbage back (or similar)  Do Not stick any thing down your splint or cast such as pencils, money, or hangers to try and scratch yourself with.  If you feel itchy take benadryl as prescribed on the bottle for itching  IF YOU ARE IN A CAM BOOT (BLACK BOOT)  You may remove boot periodically. Perform daily dressing changes as noted below.  Wash the liner of the boot regularly and wear a sock when wearing the boot. It is recommended that you sleep in the boot until told otherwise   CALL THE OFFICE WITH ANY QUESTIONS OR CONCERNS: [phone]   VISIT OUR WEBSITE FOR ADDITIONAL INFORMATION: [email or web address]       Discharge Wound Care Instructions  Do NOT apply any ointments, solutions or lotions to pin sites or surgical wounds.  These prevent needed drainage and even though solutions like hydrogen peroxide kill bacteria, they also damage cells lining the pin sites that help fight infection.  Applying lotions or ointments can keep the wounds moist and can cause them to breakdown and open up as well. This can increase the risk for infection. When in doubt call the office.  Surgical incisions should be dressed daily.  If any drainage is noted, use one layer of adaptic, then gauze, Kerlix, and an ace wrap.  Once the incision is completely dry and without drainage, it may be left open to air out.  Showering  may begin 36-48 hours later.  Cleaning gently with soap and water.  Traumatic wounds should be dressed daily as well.    One layer of adaptic, gauze, Kerlix, then ace wrap.  The adaptic can be discontinued once the draining has ceased    If you have a wet to dry dressing: wet the gauze with saline the squeeze as much saline out so the gauze is moist (not soaking wet), place moistened gauze over wound, then place a dry gauze over the moist one, followed by Kerlix wrap, then ace wrap.
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
## 2020-03-18 NOTE — Anesthesia Postprocedure Evaluation (Signed)
Anesthesia Post Note  Patient: Cody Weiss  Procedure(s) Performed: INTRAMEDULLARY (IM) NAIL TIBIAL (Left ) OPEN REDUCTION INTERNAL FIXATION (ORIF) ANKLE FRACTURE (Left Ankle)     Patient location during evaluation: PACU Anesthesia Type: General Level of consciousness: awake and alert, oriented and patient cooperative Pain management: pain level controlled Vital Signs Assessment: post-procedure vital signs reviewed and stable Respiratory status: spontaneous breathing, nonlabored ventilation and respiratory function stable Cardiovascular status: blood pressure returned to baseline and stable Postop Assessment: no apparent nausea or vomiting Anesthetic complications: no    Last Vitals:  Vitals:   03/18/20 1445 03/18/20 1500  BP: 114/77 115/83  Pulse: 77 73  Resp: 18 18  Temp:  37.1 C  SpO2: 98% 96%    Last Pain:  Vitals:   03/18/20 1500  TempSrc:   PainSc: 0-No pain                 Lauri Purdum,E. Rosa Gambale

## 2020-03-18 NOTE — Plan of Care (Signed)
  Problem: Activity: Goal: Ability to increase mobility will improve Outcome: Progressing

## 2020-03-19 ENCOUNTER — Encounter (HOSPITAL_COMMUNITY): Payer: Self-pay | Admitting: Orthopedic Surgery

## 2020-03-19 DIAGNOSIS — I10 Essential (primary) hypertension: Secondary | ICD-10-CM | POA: Diagnosis present

## 2020-03-19 LAB — CBC
HCT: 36.3 % — ABNORMAL LOW (ref 39.0–52.0)
Hemoglobin: 12.2 g/dL — ABNORMAL LOW (ref 13.0–17.0)
MCH: 32.3 pg (ref 26.0–34.0)
MCHC: 33.6 g/dL (ref 30.0–36.0)
MCV: 96 fL (ref 80.0–100.0)
Platelets: 158 10*3/uL (ref 150–400)
RBC: 3.78 MIL/uL — ABNORMAL LOW (ref 4.22–5.81)
RDW: 15.2 % (ref 11.5–15.5)
WBC: 9.2 10*3/uL (ref 4.0–10.5)
nRBC: 0 % (ref 0.0–0.2)

## 2020-03-19 LAB — BASIC METABOLIC PANEL
Anion gap: 9 (ref 5–15)
BUN: 6 mg/dL (ref 6–20)
CO2: 24 mmol/L (ref 22–32)
Calcium: 8.2 mg/dL — ABNORMAL LOW (ref 8.9–10.3)
Chloride: 104 mmol/L (ref 98–111)
Creatinine, Ser: 0.95 mg/dL (ref 0.61–1.24)
GFR calc Af Amer: >60 mL/min (ref >60)
GFR calc non Af Amer: >60 mL/min (ref >60)
Glucose, Bld: 171 mg/dL — ABNORMAL HIGH (ref 70–99)
Potassium: 3.9 mmol/L (ref 3.5–5.1)
Sodium: 137 mmol/L (ref 135–145)

## 2020-03-19 MED ORDER — METHOCARBAMOL 1000 MG/10ML IJ SOLN: 500.0000 mg | Freq: Four times a day (QID) | INTRAVENOUS | Status: DC

## 2020-03-19 MED ORDER — VITAMIN D 25 MCG (1000 UNIT) PO TABS
2000.0000 [IU] | ORAL_TABLET | Freq: Two times a day (BID) | ORAL | Status: DC
  Administered 2020-03-19 – 2020-03-22 (×7): 2000 [IU] via ORAL
  Filled 2020-03-19 (×7): qty 2

## 2020-03-19 MED ORDER — METHOCARBAMOL 500 MG PO TABS
500.0000 mg | ORAL_TABLET | Freq: Four times a day (QID) | ORAL | Status: DC
  Administered 2020-03-19 – 2020-03-22 (×12): 500 mg via ORAL
  Filled 2020-03-19 (×13): qty 1

## 2020-03-19 NOTE — H&P (Signed)
Please see orthopaedic consult notes for full H&P.

## 2020-03-19 NOTE — Evaluation (Addendum)
Occupational Therapy Evaluation Patient Details Name: Cody Weiss MRN: [id number] DOB: [birth year] Today's Date: 03/19/2020    History of Present Illness this 49 y.o. male admitted after work related accident.  He susatained segmental Lt tibia fx, fibular shaft fx, and syndesmotic disruption.  He underwent ORIF of Lt LE and will be NWB for 8 weeks.  PMH includes: PNA, HTN   Clinical Impression   Pt admitted with above. He demonstrates the below listed deficits and will benefit from continued OT to maximize safety and independence with BADLs.  Pt presents to OT with miminimal pain, impaired balance due to NWB, and decreased safety awareness - pt moves quickly.  He currently requires min A for LB ADLs due to difficulty maintaining NWB while pulling pants over hips.  He lives with family and was fully independent PTA.  He has good support at discharge and should progress quickly to mod I.  Will follow acutely.       Follow Up Recommendations  No OT follow up;Supervision - Intermittent    Equipment Recommendations  Tub/shower transfer bench;3 in 1 bedside commode;Wheelchair; reacher, Hand held shower, cast cover for showering     Recommendations for Other Services       Precautions / Restrictions Precautions Precautions: Fall Precaution Comments: Pt moves quickly and is a bit impulsive  Restrictions Weight Bearing Restrictions: Yes LLE Weight Bearing: Non weight bearing      Mobility Bed Mobility Overal bed mobility: Modified Independent                Transfers Overall transfer level: Needs assistance Equipment used: Rolling walker (2 wheeled) Transfers: Sit to/from [employer] Sit to Stand: Min guard Stand pivot transfers: Min guard       General transfer comment: Pt inquiring about use of knee scooter.  Spoke with PA, who does not feel knee scooter appropriate due to location of fractures and NWB - this was passed on to pt and he is agreeable to  using w/c for distance mobility     Balance Overall balance assessment: Needs assistance Sitting-balance support: Feet supported Sitting balance-Leahy Scale: Normal     Standing balance support: During functional activity;Single extremity supported Standing balance-Leahy Scale: Poor Standing balance comment: requires UE support                            ADL either performed or assessed with clinical judgement   ADL Overall ADL's : Needs assistance/impaired Eating/Feeding: Independent   Grooming: Wash/dry hands;Wash/dry face;Oral care;Brushing hair;Set up;Sitting   Upper Body Bathing: Set up;Sitting   Lower Body Bathing: Minimal assistance;Sit to/from stand   Upper Body Dressing : Set up;Sitting   Lower Body Dressing: Minimal assistance;Sit to/from stand Lower Body Dressing Details (indicate cue type and reason): assist to pull pants over hips due to difficulty maintaining balance  Toilet Transfer: Min guard;Ambulation;Comfort height toilet;Grab bars;RW   Toileting- Clothing Manipulation and Hygiene: Minimal assistance;Sit to/from stand Toileting - Clothing Manipulation Details (indicate cue type and reason): assist pulling pants over hips  Tub/ Shower Transfer: Tub transfer;Min guard;Ambulation;Tub bench;Rolling walker Tub/Shower Transfer Details (indicate cue type and reason): Pt instructed in tub transfer bench use and hand held shower  Functional mobility during ADLs: Min guard;Rolling walker General ADL Comments: requires assist for balance while attempting to maintain NWB      Vision Patient Visual Report: No change from baseline       Perception  Praxis      Pertinent Vitals/Pain Pain Assessment: Faces Faces Pain Scale: Hurts little more Pain Location: Lt LE at end of session  Pain Descriptors / Indicators: Aching Pain Intervention(s): Monitored during session;Repositioned     Hand Dominance Right   Extremity/Trunk Assessment Upper  Extremity Assessment Upper Extremity Assessment: Overall WFL for tasks assessed   Lower Extremity Assessment Lower Extremity Assessment: Defer to PT evaluation   Cervical / Trunk Assessment Cervical / Trunk Assessment: Normal   Communication Communication Communication: No difficulties   Cognition Arousal/Alertness: Awake/alert Behavior During Therapy: WFL for tasks assessed/performed;Impulsive Overall Cognitive Status: Within Functional Limits for tasks assessed                                     General Comments       Exercises     Shoulder Instructions      Home Living Family/patient expects to be discharged to:: Private residence Living Arrangements: Spouse/significant other;Other relatives Available Help at Discharge: Family;Available 24 hours/day Type of Home: House Home Access: Ramped entrance     Home Layout: One level     Bathroom Shower/Tub: Tub/shower unit;Curtain   Firefighter: Standard     Home Equipment: Crutches;Walker - 2 wheels;Wheelchair - manual(w/c does not have elevating leg rests )          Prior Functioning/Environment Level of Independence: Independent        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 Problem List: Decreased activity tolerance;Impaired balance (sitting and/or standing);Decreased safety awareness;Decreased knowledge of use of DME or AE;Decreased knowledge of precautions;Pain      OT Treatment/Interventions: Self-care/ADL training;DME and/or AE instruction;Therapeutic activities;Patient/family education;Balance training    OT Goals(Current goals can be found in the care plan section) Acute Rehab OT Goals Patient Stated Goal: to get back to normal  OT Goal Formulation: With patient Time For Goal Achievement: 03/26/20 Potential to Achieve Goals: Good ADL Goals Pt Will Perform Grooming: (P) with supervision;standing Pt Will Perform Lower Body Bathing: (P) with supervision;sit  to/from stand Pt Will Perform Lower Body Dressing: (P) with supervision;sit to/from stand Pt Will Transfer to Toilet: (P) with supervision;ambulating;regular height toilet;bedside commode;grab bars Pt Will Perform Toileting - Clothing Manipulation and hygiene: (P) with supervision;sit to/from stand  OT Frequency: Min 2X/week   Barriers to D/C:            Co-evaluation PT/OT/SLP Co-Evaluation/Treatment: Yes Reason for Co-Treatment: For patient/therapist safety;To address functional/ADL transfers   OT goals addressed during session: ADL's and self-care      AM-PAC OT "6 Clicks" Daily Activity     Outcome Measure Help from another person eating meals?: None Help from another person taking care of personal grooming?: A Little Help from another person toileting, which includes using toliet, bedpan, or urinal?: A Little Help from another person bathing (including washing, rinsing, drying)?: A Little Help from another person to put on and taking off regular upper body clothing?: A Little Help from another person to put on and taking off regular lower body clothing?: A Little 6 Click Score: 19   End of Session Equipment Utilized During Treatment: Gait belt;Rolling walker Nurse Communication: Mobility status  Activity Tolerance: Patient tolerated treatment well Patient left: in chair;with call bell/phone within reach;with chair alarm set;with family/visitor present  OT Visit Diagnosis: Unsteadiness on feet (R26.81);Pain Pain - Right/Left: Left Pain -  part of body: Leg                Time: 1005-1034 OT Time Calculation (min): 29 min Charges:  OT General Charges $OT Visit: 1 Visit OT Evaluation $OT Eval Moderate Complexity: 1 Mod  Nilsa Nutting., OTR/L Acute Rehabilitation Services Pager [phone] Office [phone]   Lucille Passy M 03/19/2020, 11:49 AM

## 2020-03-19 NOTE — Progress Notes (Addendum)
PT is recommending a 3-in-1 BSC and a W/C with cushion and elevating leg rest. Contacted Maureen with Workers' Comp at [phone] and left a VM regarding DME needed. Notified Montez Morita, PA that pt needs  DME orders and a medical necessity progress note for the W/C. Will continue to f/u to assist with the D/C plan.

## 2020-03-19 NOTE — Progress Notes (Addendum)
Orthopaedic Trauma Service Progress Note  Patient ID: Cody Weiss MRN: [id number] DOB/AGE: [birth year] 49 y.o.  Subjective:  Doing well No specific complaints Has not worked with therapy yet Pain tolerable Block is worn off, wore off around midnight  Lives in a single-story house, has a ramp to get into his house  No chest pain or shortness of breath No nausea or vomiting No abdominal pain  ROS As above  Objective:   VITALS:   Vitals:   03/18/20 1500 03/18/20 2300 03/19/20 0323 03/19/20 0745  BP: 115/83 129/79 122/76 124/78  Pulse: 73 61 82 88  Resp: 18 18 18 18   Temp: 98.7 F (37.1 C) 98.3 F (36.8 C) 98.3 F (36.8 C) 98.2 F (36.8 C)  TempSrc:  Oral Oral Oral  SpO2: 96% 96% 99% 100%  Weight:        Estimated body mass index is 34.81 kg/m as calculated from the following:   Height as of 11/18/18: 5' 4.5" (1.638 m).   Weight as of this encounter: 93.4 kg.   Intake/Output      04/02 0701 - 04/03 0700 04/03 0701 - 04/04 0700   P.O. 920 240   I.V. (mL/kg) 1000 (10.7)    IV Piggyback 200    Total Intake(mL/kg) 2120 (22.7) 240 (2.6)   Urine (mL/kg/hr) 1100 (0.5) 400 (1.1)   Stool 0    Blood 75    Total Output 1175 400   Net +945 -160        Stool Occurrence 1 x      LABS  Results for orders placed or performed during the hospital encounter of 03/17/20 (from the past 24 hour(s))  CBC     Status: None   Collection Time: 03/18/20  3:45 PM  Result Value Ref Range   WBC 6.2 4.0 - 10.5 K/uL   RBC 4.26 4.22 - 5.81 MIL/uL   Hemoglobin 13.6 13.0 - 17.0 g/dL   HCT 40.5 39.0 - 52.0 %   MCV 95.1 80.0 - 100.0 fL   MCH 31.9 26.0 - 34.0 pg   MCHC 33.6 30.0 - 36.0 g/dL   RDW 15.5 11.5 - 15.5 %   Platelets 157 150 - 400 K/uL   nRBC 0.0 0.0 - 0.2 %  Creatinine, serum     Status: None   Collection Time: 03/18/20  3:45 PM  Result Value Ref Range   Creatinine, Ser 0.77 0.61 - 1.24 mg/dL   GFR calc non Af Amer >60 >60 mL/min   GFR calc Af Amer >60 >60 mL/min  VITAMIN D 25 Hydroxy (Vit-D Deficiency, Fractures)     Status: Abnormal   Collection Time: 03/18/20  6:33 PM  Result Value Ref Range   Vit D, 25-Hydroxy 13.11 (L) 30 - 100 ng/mL  Basic metabolic panel     Status: Abnormal   Collection Time: 03/19/20  3:11 AM  Result Value Ref Range   Sodium 137 135 - 145 mmol/L   Potassium 3.9 3.5 - 5.1 mmol/L   Chloride 104 98 - 111 mmol/L   CO2 24 22 - 32 mmol/L   Glucose, Bld 171 (H) 70 - 99 mg/dL   BUN 6 6 - 20 mg/dL   Creatinine, Ser 0.95 0.61 - 1.24 mg/dL   Calcium 8.2 (L) 8.9 - 10.3 mg/dL  GFR calc non Af Amer >60 >60 mL/min   GFR calc Af Amer >60 >60 mL/min   Anion gap 9 5 - 15  CBC     Status: Abnormal   Collection Time: 03/19/20  3:11 AM  Result Value Ref Range   WBC 9.2 4.0 - 10.5 K/uL   RBC 3.78 (L) 4.22 - 5.81 MIL/uL   Hemoglobin 12.2 (L) 13.0 - 17.0 g/dL   HCT 40.0 (L) 86.7 - 61.9 %   MCV 96.0 80.0 - 100.0 fL   MCH 32.3 26.0 - 34.0 pg   MCHC 33.6 30.0 - 36.0 g/dL   RDW 50.9 32.6 - 71.2 %   Platelets 158 150 - 400 K/uL   nRBC 0.0 0.0 - 0.2 %    Results for Cody Weiss, Cody Weiss (MRN [id number]) as of 03/19/2020 10:56  Ref. Range 03/18/2020 18:33  Vitamin D, 25-Hydroxy Latest Ref Range: 30 - 100 ng/mL 13.11 (L)   PHYSICAL EXAM:   Gen: Resting comfortably in bed, no acute distress, pleasant, family at bedside Lungs: Clear to auscultation bilaterally Cardiac: Regular rate and rhythm Abd:+ Bowel sounds, nontender Ext:       Left lower extremity  Dressing is stable, short leg cast is stable.  Mild strikethrough over knee incision  Extremity is warm  Swelling is controlled  + DP pulse  DPN, SPN, TN sensory functions are grossly intact  EHL, FHL and lesser toe motor function intact  No pain out of proportion with passive stretching  Assessment/Plan: 1 Day Post-Op   Principal Problem:   Closed displaced segmental fracture of shaft of left tibia Active  Problems:   Hypertension   Anti-infectives (From admission, onward)   Start     Dose/Rate Route Frequency Ordered Stop   03/18/20 2000  ceFAZolin (ANCEF) IVPB 2g/100 mL premix     2 g 200 mL/hr over 30 Minutes Intravenous Every 8 hours 03/18/20 1609 03/19/20 1959   03/18/20 1322  vancomycin (VANCOCIN) powder  Status:  Discontinued       As needed 03/18/20 1322 03/18/20 1352   03/18/20 1000  ceFAZolin (ANCEF) IVPB 2g/100 mL premix     2 g 200 mL/hr over 30 Minutes Intravenous On call to O.R. 03/18/20 8250 03/18/20 1217    .  POD/HD#: 42  49 year old male work-related accident with segmental left tibia fracture, fibular shaft fracture and syndesmotic disruption  -Work-related accident  -Closed left segmental tibia fracture, fibular shaft fracture and syndesmotic disruption s/p intramedullary nail left tibia, ORIF left syndesmosis  Nonweightbearing left leg x 8 weeks with assistance  Splint x2 weeks and then will likely convert to a cam boot to begin gentle ankle range of motion  PT and OT evaluations today  Ice and elevate for swelling and pain control  Okay to move toes and knee  Make sure patient is resting with his knee in full extension rather than partially flex so as to prevent knee contracture   - Pain management:  Titrating accordingly  Continue with current regimen  - ABL anemia/Hemodynamics  Stable  - Medical issues   Hypertension   Blood pressures are stable  - DVT/PE prophylaxis:  Lovenox while inpatient  Discharge home with aspirin 325 mg twice daily - ID:   Preoperative antibiotics - Metabolic Bone Disease:  Vitamin D deficiency   Supplement  - Activity:  Nonweightbearing left leg otherwise activity as tolerated  - FEN/GI prophylaxis/Foley/Lines:  Regular diet  -Ex-fix/Splint care:  Keep splint clean and dry  -  Impediments to fracture healing:  Vitamin D deficiency  High-energy crush injury  - Dispo:  Continue with inpatient care for pain  control and therapies  Plan for discharge tomorrow  Arrange for appropriate DME   Mearl Latin, PA-C [phone] (C) 03/19/2020, 11:00 AM  [facility] [address] [place] [address] [phone] Collier Bullock (F)

## 2020-03-19 NOTE — Progress Notes (Signed)
Met with pt and sister. He plans to return home with the support of his wife and sister. Discussed DME recommended by PT. Informed pt that I contacted his workers' comp case worker to discuss DME. Will continue to f/u to assist with the D/C plan.

## 2020-03-19 NOTE — Evaluation (Signed)
Physical Therapy Evaluation Patient Details Name: Cody Weiss MRN: [id number] DOB: April 03, 1971 Today's Date: 03/19/2020   History of Present Illness  Pt is a 49 y/o male s/p IM nail and ORIF L tib-fib fx secondary to a work-related injury. PMH including but not limited to HTN.    Clinical Impression  Pt presented supine in bed with HOB elevated, awake and willing to participate in therapy session. Prior to admission, pt reported that he was independent with all functional mobility and ADLs. He lives with his spouse and several other family members in a single level home with a ramped entrance. At the time of evaluation, pt overall moving very well; however, slightly impulsive and required cueing for safety. Pt tolerated hallway ambulation with RW and min guard for safety, no LOB or need for physical assistance. He will have 24/7 supervision/assistance at home and does not require any stair training as he has a ramped entrance to his home. Pt is ready to d/c home from a PT perspective as long as he has the below listed DME.   PT will continue to follow pt acutely to progress mobility as tolerated.     Follow Up Recommendations No PT follow up;Supervision for mobility/OOB    Equipment Recommendations  3in1 (PT);Wheelchair (measurements PT);Wheelchair cushion (measurements PT);Other (comment)(w/c with elevating leg rests)    Recommendations for Other Services       Precautions / Restrictions Precautions Precautions: Fall Precaution Comments: Pt moves quickly and is a bit impulsive  Restrictions Weight Bearing Restrictions: Yes LLE Weight Bearing: Non weight bearing      Mobility  Bed Mobility Overal bed mobility: Modified Independent                Transfers Overall transfer level: Needs assistance Equipment used: Rolling walker (2 wheeled) Transfers: Sit to/from [employer] Sit to Stand: Min guard Stand pivot transfers: Min guard       General transfer  comment: min guard for safety with transitional movement as pt a bit impulsive, cueing for technique  Ambulation/Gait Ambulation/Gait assistance: Min guard Gait Distance (Feet): 100 Feet Assistive device: Rolling walker (2 wheeled) Gait Pattern/deviations: (hop-to on R LE) Gait velocity: slightly decreased   General Gait Details: pt overall steady with use of RW, cueing needed to maintain proximity to RW and for safety in general  Stairs            Wheelchair Mobility    Modified Rankin (Stroke Patients Only)       Balance Overall balance assessment: Needs assistance Sitting-balance support: Feet supported Sitting balance-Leahy Scale: Normal     Standing balance support: During functional activity;Single extremity supported Standing balance-Leahy Scale: Poor Standing balance comment: requires UE support                              Pertinent Vitals/Pain Pain Assessment: Faces Faces Pain Scale: Hurts little more Pain Location: L knee/LE Pain Descriptors / Indicators: Aching Pain Intervention(s): Monitored during session;Repositioned    Home Living Family/patient expects to be discharged to:: Private residence Living Arrangements: Spouse/significant other;Other relatives Available Help at Discharge: Family;Available 24 hours/day Type of Home: House Home Access: Ramped entrance     Home Layout: One level Home Equipment: Crutches;Walker - 2 wheels;Wheelchair - manual(w/c does not have elevating leg rests )      Prior Function Level of Independence: Independent         Comments: 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      Hand Dominance   Dominant Hand: Right    Extremity/Trunk Assessment   Upper Extremity Assessment Upper Extremity Assessment: Defer to OT evaluation;Overall [facility] for tasks assessed    Lower Extremity Assessment Lower Extremity Assessment: LLE deficits/detail LLE Deficits / Details: pt able to move LE through partial ROM  actively against gravity; unable to fully assess with lower leg casted from foot to knee LLE: Unable to fully assess due to immobilization;Unable to fully assess due to pain LLE Sensation: WNL    Cervical / Trunk Assessment Cervical / Trunk Assessment: Normal  Communication   Communication: No difficulties  Cognition Arousal/Alertness: Awake/alert Behavior During Therapy: WFL for tasks assessed/performed;Impulsive Overall Cognitive Status: Within Functional Limits for tasks assessed                                        General Comments      Exercises     Assessment/Plan    PT Assessment Patient needs continued PT services  PT Problem List Decreased balance;Decreased mobility;Decreased coordination;Decreased knowledge of use of DME;Decreased safety awareness;Decreased knowledge of precautions       PT Treatment Interventions DME instruction;Gait training;Functional mobility training;Balance training;Therapeutic activities;Therapeutic exercise;Neuromuscular re-education;Patient/family education    PT Goals (Current goals can be found in the Care Plan section)  Acute Rehab PT Goals Patient Stated Goal: to get back to normal  PT Goal Formulation: With patient Time For Goal Achievement: 04/02/20 Potential to Achieve Goals: Good    Frequency Min 3X/week   Barriers to discharge        Co-evaluation PT/OT/SLP Co-Evaluation/Treatment: Yes Reason for Co-Treatment: For patient/therapist safety PT goals addressed during session: Mobility/safety with mobility;Balance;Proper use of DME;Strengthening/ROM OT goals addressed during session: ADL's and self-care       AM-PAC PT "6 Clicks" Mobility  Outcome Measure Help needed turning from your back to your side while in a flat bed without using bedrails?: None Help needed moving from lying on your back to sitting on the side of a flat bed without using bedrails?: None Help needed moving to and from a bed to a  chair (including a wheelchair)?: None Help needed standing up from a chair using your arms (e.g., wheelchair or bedside chair)?: None Help needed to walk in hospital room?: A Little Help needed climbing 3-5 steps with a railing? : Total 6 Click Score: 20    End of Session Equipment Utilized During Treatment: Gait belt Activity Tolerance: Patient tolerated treatment well Patient left: in chair;with call bell/phone within reach;with chair alarm set;with family/visitor present Nurse Communication: Mobility status PT Visit Diagnosis: Other abnormalities of gait and mobility (R26.89)    Time: 1001-1034 PT Time Calculation (min) (ACUTE ONLY): 33 min   Charges:   PT Evaluation $PT Eval Moderate Complexity: 1 Mod          Eduard Clos, PT, DPT  Acute Rehabilitation Services Pager [phone] Office [place] 03/19/2020, 1:05 PM

## 2020-03-19 NOTE — Plan of Care (Signed)
  Problem: Pain Management: Goal: Pain level will decrease with appropriate interventions 03/19/2020 1314 by Darrell Jewel, RN Outcome: Progressing 03/19/2020 1314 by Darrell Jewel, RN Outcome: Progressing

## 2020-03-20 LAB — BASIC METABOLIC PANEL
Anion gap: 10 (ref 5–15)
BUN: <5 mg/dL — ABNORMAL LOW (ref 6–20)
CO2: 28 mmol/L (ref 22–32)
Calcium: 8.4 mg/dL — ABNORMAL LOW (ref 8.9–10.3)
Chloride: 102 mmol/L (ref 98–111)
Creatinine, Ser: 0.7 mg/dL (ref 0.61–1.24)
GFR calc Af Amer: >60 mL/min (ref >60)
GFR calc non Af Amer: >60 mL/min (ref >60)
Glucose, Bld: 116 mg/dL — ABNORMAL HIGH (ref 70–99)
Potassium: 3.6 mmol/L (ref 3.5–5.1)
Sodium: 140 mmol/L (ref 135–145)

## 2020-03-20 LAB — CBC
HCT: 36.9 % — ABNORMAL LOW (ref 39.0–52.0)
Hemoglobin: 12.2 g/dL — ABNORMAL LOW (ref 13.0–17.0)
MCH: 32.3 pg (ref 26.0–34.0)
MCHC: 33.1 g/dL (ref 30.0–36.0)
MCV: 97.6 fL (ref 80.0–100.0)
Platelets: 154 10*3/uL (ref 150–400)
RBC: 3.78 MIL/uL — ABNORMAL LOW (ref 4.22–5.81)
RDW: 15.3 % (ref 11.5–15.5)
WBC: 7.7 10*3/uL (ref 4.0–10.5)
nRBC: 0 % (ref 0.0–0.2)

## 2020-03-20 LAB — HEMOGLOBIN A1C
Hgb A1c MFr Bld: 5.4 % (ref 4.8–5.6)
Mean Plasma Glucose: 108.28 mg/dL

## 2020-03-20 NOTE — Care Management (Signed)
    Durable Medical Equipment  (From admission, onward)         Start     Ordered   03/19/20 1701  For home use only DME Bedside commode  Once    Question:  Patient needs a bedside commode to treat with the following condition  Answer:  Closed left tibial fracture   03/19/20 1701   03/19/20 1701  For home use only DME standard manual wheelchair with seat cushion  Once    Comments: Patient suffers from left tibia and fibula, obesity fracture which impairs their ability to perform daily activities like bathing, dressing, grooming and toileting in the home.  A cane or crutch will not resolve issue with performing activities of daily living. A wheelchair will allow patient to safely perform daily activities. Patient can safely propel the wheelchair in the home or has a caregiver who can provide assistance. Length of need 6 months . Accessories: elevating leg rests (ELRs), wheel locks, extensions and anti-tippers.   03/19/20 1701

## 2020-03-20 NOTE — Plan of Care (Signed)
  Problem: Activity: Goal: Ability to increase mobility will improve Outcome: Progressing   Problem: Pain Management: Goal: Pain level will decrease with appropriate interventions Outcome: Progressing   Problem: Skin Integrity: Goal: Will show signs of wound healing Outcome: Progressing   

## 2020-03-20 NOTE — Progress Notes (Signed)
Occupational Therapy Treatment Patient Details Name: Cody Weiss MRN: [id number] DOB: [birth year] Today's Date: 03/20/2020    History of present illness Pt is a 49 y/o male s/p IM nail and ORIF L tib-fib fx secondary to a work-related injury. PMH including but not limited to HTN.   OT comments  Pt is progressing toward OT goals. He is able to perform ADLs with set up assist to min guard assist (due to balance).  He required less cues today for safety.  See below for DME/AE recommendations for home.   Follow Up Recommendations  No OT follow up;Supervision - Intermittent    Equipment Recommendations  Tub/shower bench;3 in 1 bedside commode;Wheelchair; reacher, cast cover, Hand held shower head    Recommendations for Other Services      Precautions / Restrictions Precautions Precautions: Fall Precaution Comments: Pt moves quickly and is a bit impulsive  Restrictions Weight Bearing Restrictions: Yes LLE Weight Bearing: Non weight bearing       Mobility Bed Mobility Overal bed mobility: Modified Independent                Transfers Overall transfer level: Needs assistance Equipment used: Rolling walker (2 wheeled) Transfers: Sit to/from [facility] Sit to Stand: Min guard Stand pivot transfers: Min guard       General transfer comment: min guard for safety with transitional movement as pt a bit impulsive, cueing for technique    Balance Overall balance assessment: Needs assistance Sitting-balance support: Feet supported Sitting balance-Leahy Scale: Normal     Standing balance support: During functional activity;Single extremity supported Standing balance-Leahy Scale: Poor Standing balance comment: Requires UE support                            ADL either performed or assessed with clinical judgement   ADL Overall ADL's : Needs assistance/impaired     Grooming: Wash/dry hands;Wash/dry face;Oral care;Min guard;Standing Grooming  Details (indicate cue type and reason): min guard for safety while standing and maintainng TDWB      Lower Body Bathing: Min guard;Sit to/from stand       Lower Body Dressing: Min guard;Sit to/from stand   Toilet Transfer: Min guard;Ambulation;Comfort height toilet   Toileting- Clothing Manipulation and Hygiene: Min guard;Sit to/from stand       Functional mobility during ADLs: Min guard       Vision       Perception     Praxis      Cognition Arousal/Alertness: Awake/alert Behavior During Therapy: WFL for tasks assessed/performed;Impulsive Overall Cognitive Status: Within Functional Limits for tasks assessed                                          Exercises     Shoulder Instructions       General Comments      Pertinent Vitals/ Pain       Pain Assessment: Faces Faces Pain Scale: Hurts even more Pain Location: L knee/LE Pain Descriptors / Indicators: Aching Pain Intervention(s): Monitored during session;Repositioned;Ice applied;Patient requesting pain meds-RN notified  Home Living                                          Prior Functioning/Environment  Frequency  Min 2X/week        Progress Toward Goals  OT Goals(current goals can now be found in the care plan section)  Progress towards OT goals: Progressing toward goals     Plan Discharge plan remains appropriate    Co-evaluation                 AM-PAC OT "6 Clicks" Daily Activity     Outcome Measure   Help from another person eating meals?: None Help from another person taking care of personal grooming?: A Little Help from another person toileting, which includes using toliet, bedpan, or urinal?: A Little Help from another person bathing (including washing, rinsing, drying)?: A Little Help from another person to put on and taking off regular upper body clothing?: A Little Help from another person to put on and taking off regular  lower body clothing?: A Little 6 Click Score: 19    End of Session Equipment Utilized During Treatment: Gait belt;Rolling walker  OT Visit Diagnosis: Unsteadiness on feet (R26.81);Pain Pain - Right/Left: Left Pain - part of body: Leg   Activity Tolerance Patient tolerated treatment well   Patient Left in chair;with call bell/phone within reach;with chair alarm set   Nurse Communication Mobility status        Time: 6203-5597 OT Time Calculation (min): 21 min  Charges: OT General Charges $OT Visit: 1 Visit OT Treatments $Self Care/Home Management : 8-22 mins  Eber Jones OTR/L Acute Rehabilitation Services Pager [phone] Office [phone]    Jeani Hawking M 03/20/2020, 2:40 PM

## 2020-03-20 NOTE — Progress Notes (Signed)
Wife noticed some bruising on pt LLE right above the top of the dressing at that runs above and behind the knee she expressed some concern . I had charge nurse assess she stated that it appeared normal for the surgery/trauma that the pt had and it was not hard to the touch nor painful to the pt  MD notified and he stated that he was assess in the am

## 2020-03-20 NOTE — Progress Notes (Signed)
Orthopaedic Trauma Service Progress Note  Patient ID: Cody Weiss MRN: [id number] DOB/AGE: 1971-11-16 49 y.o.  Subjective:  Doing fair Mobilizing slowly  A little unsteady still with walker   Doesn't feel comfortable going home today    ROS As above  Objective:   VITALS:   Vitals:   03/19/20 1307 03/19/20 1921 03/20/20 0349 03/20/20 0753  BP: (!) 144/90 (!) 136/93 (!) 136/91 (!) 142/85  Pulse: 67 63 67 95  Resp: 18 15 15 17   Temp: 98.1 F (36.7 C) 98.7 F (37.1 C) 98.2 F (36.8 C) 98.3 F (36.8 C)  TempSrc: Oral Oral Oral Oral  SpO2: 100% 100% 95% 100%  Weight:        Estimated body mass index is 34.81 kg/m as calculated from the following:   Height as of 11/18/18: 5' 4.5" (1.638 m).   Weight as of this encounter: 93.4 kg.   Intake/Output      04/03 0701 - 04/04 0700 04/04 0701 - 04/05 0700   P.O. 480    I.V. (mL/kg)     IV Piggyback     Total Intake(mL/kg) 480 (5.1)    Urine (mL/kg/hr) 3125 (1.4)    Stool     Blood     Total Output 3125    Net -2645         Urine Occurrence 4 x      LABS  Results for orders placed or performed during the hospital encounter of 03/17/20 (from the past 24 hour(s))  Basic metabolic panel     Status: Abnormal   Collection Time: 03/20/20  3:31 AM  Result Value Ref Range   Sodium 140 135 - 145 mmol/L   Potassium 3.6 3.5 - 5.1 mmol/L   Chloride 102 98 - 111 mmol/L   CO2 28 22 - 32 mmol/L   Glucose, Bld 116 (H) 70 - 99 mg/dL   BUN <5 (L) 6 - 20 mg/dL   Creatinine, Ser 05/20/20 0.61 - 1.24 mg/dL   Calcium 8.4 (L) 8.9 - 10.3 mg/dL   GFR calc non Af Amer >60 >60 mL/min   GFR calc Af Amer >60 >60 mL/min   Anion gap 10 5 - 15  CBC     Status: Abnormal   Collection Time: 03/20/20  3:31 AM  Result Value Ref Range   WBC 7.7 4.0 - 10.5 K/uL   RBC 3.78 (L) 4.22 - 5.81 MIL/uL   Hemoglobin 12.2 (L) 13.0 - 17.0 g/dL   HCT 05/20/20 (L) 48.5 - 46.2 %   MCV  97.6 80.0 - 100.0 fL   MCH 32.3 26.0 - 34.0 pg   MCHC 33.1 30.0 - 36.0 g/dL   RDW 70.3 50.0 - 93.8 %   Platelets 154 150 - 400 K/uL   nRBC 0.0 0.0 - 0.2 %  Hemoglobin A1c     Status: None   Collection Time: 03/20/20  3:31 AM  Result Value Ref Range   Hgb A1c MFr Bld 5.4 4.8 - 5.6 %   Mean Plasma Glucose 108.28 mg/dL     PHYSICAL EXAM:   Gen: Resting comfortably in bed, no acute distress, pleasant, family at bedside Lungs: Clear to auscultation bilaterally Cardiac: Regular rate and rhythm Abd:+ Bowel sounds, nontender Ext:       Left lower extremity  Dressing is stable, short leg cast is stable.  Mild strikethrough over knee incision             Extremity is warm             Swelling is controlled             + DP pulse             DPN, SPN, TN sensory functions are grossly intact             EHL, FHL and lesser toe motor function intact             No pain out of proportion with passive stretching    Assessment/Plan: 2 Days Post-Op   Principal Problem:   Closed displaced segmental fracture of shaft of left tibia Active Problems:   Hypertension   Anti-infectives (From admission, onward)   Start     Dose/Rate Route Frequency Ordered Stop   03/18/20 2000  ceFAZolin (ANCEF) IVPB 2g/100 mL premix     2 g 200 mL/hr over 30 Minutes Intravenous Every 8 hours 03/18/20 1609 03/19/20 1358   03/18/20 1322  vancomycin (VANCOCIN) powder  Status:  Discontinued       As needed 03/18/20 1322 03/18/20 1352   03/18/20 1000  ceFAZolin (ANCEF) IVPB 2g/100 mL premix     2 g 200 mL/hr over 30 Minutes Intravenous On call to O.R. 03/18/20 7672 03/18/20 1217    .  POD/HD#: 60  49 year old male work-related accident with segmental left tibia fracture, fibular shaft fracture and syndesmotic disruption   -Work-related accident   -Closed left segmental tibia fracture, fibular shaft fracture and syndesmotic disruption s/p intramedullary nail left tibia, ORIF left syndesmosis              Nonweightbearing left leg x 8 weeks with assistance             Splint x2 weeks and then will likely convert to a cam boot to begin gentle ankle range of motion             PT and OT             Ice and elevate for swelling and pain control             Okay to move toes and knee             Make sure patient is resting with his knee in full extension rather than partially flex so as to prevent knee contracture     - Pain management:             Titrating accordingly             Continue with current regimen   - ABL anemia/Hemodynamics             Stable   - Medical issues              Hypertension                         Blood pressures are stable   - DVT/PE prophylaxis:             Lovenox while inpatient             Discharge home with aspirin 325 mg twice daily - ID:              Preoperative antibiotics - Metabolic Bone Disease:  Vitamin D deficiency                         Supplement   - Activity:             Nonweightbearing left leg otherwise activity as tolerated   - FEN/GI prophylaxis/Foley/Lines:             Regular diet   -Ex-fix/Splint care:             Keep splint clean and dry   - Impediments to fracture healing:             Vitamin D deficiency             High-energy crush injury   - Dispo:             Continue with inpatient care for pain control and therapies             Plan for discharge tomorrow             Arrange for appropriate DME   Mearl Latin, PA-C [phone] (C) 03/20/2020, 9:34 AM  [facility] [address] [place] [address] [phone] Collier Bullock (F)

## 2020-03-21 ENCOUNTER — Encounter: Payer: Self-pay | Admitting: *Deleted

## 2020-03-21 DIAGNOSIS — S82462A Displaced segmental fracture of shaft of left fibula, initial encounter for closed fracture: Secondary | ICD-10-CM | POA: Insufficient documentation

## 2020-03-21 DIAGNOSIS — S93432A Sprain of tibiofibular ligament of left ankle, initial encounter: Secondary | ICD-10-CM

## 2020-03-21 LAB — BASIC METABOLIC PANEL
Anion gap: 11 (ref 5–15)
BUN: <5 mg/dL — ABNORMAL LOW (ref 6–20)
CO2: 30 mmol/L (ref 22–32)
Calcium: 8.8 mg/dL — ABNORMAL LOW (ref 8.9–10.3)
Chloride: 99 mmol/L (ref 98–111)
Creatinine, Ser: 0.79 mg/dL (ref 0.61–1.24)
GFR calc Af Amer: >60 mL/min (ref >60)
GFR calc non Af Amer: >60 mL/min (ref >60)
Glucose, Bld: 125 mg/dL — ABNORMAL HIGH (ref 70–99)
Potassium: 3.3 mmol/L — ABNORMAL LOW (ref 3.5–5.1)
Sodium: 140 mmol/L (ref 135–145)

## 2020-03-21 LAB — CALCIUM, IONIZED: Calcium, Ionized, Serum: 4.7 mg/dL (ref 4.5–5.6)

## 2020-03-21 MED ORDER — VITAMIN D 125 MCG (5000 UT) PO CAPS: 5000.0000 [IU] | ORAL_CAPSULE | Freq: Every day | ORAL | 1 refills | Status: DC

## 2020-03-21 NOTE — Discharge Summary (Addendum)
Orthopaedic Trauma Service (OTS) Discharge Summary   Patient ID: Cody Weiss MRN: [id number] DOB/AGE: Dec 08, 1971 49 y.o.  Admit date: 03/17/2020 Discharge date: 03/22/2020  Admission Diagnoses: 1. Left tibia shaft fracture 2. Left fibula shaft fracture  Discharge Diagnoses:  Principal Problem:   Closed displaced segmental fracture of shaft of left tibia Active Problems:   Hypertension   Ankle syndesmosis disruption, left, initial encounter   Displaced segmental fracture of shaft of left fibula, initial encounter for closed fracture   Past Medical History:  Diagnosis Date  . Bronchitis   . Hypertension   . Pneumonia 10/2016     Procedures Performed: 1. CPT 27759-Intramedullary nailing of left segmental tibia fracture 2. CPT 96222-LNLG reduction internal fixation of left lateral malleolus 3. CPT 27829-Open reduction internal fixation of left syndesmosis  Discharged Condition: good  Hospital Course: Patient presented to [facility] emergency department on 03/17/2020 with complaints of left leg pain and deformity following a worksite accident.  Was found to have a left tibial shaft fracture.  Orthopedic surgery was consulted.  Patient was placed in a long-leg splint in the ED and admitted for pain control.  Patient taken to the operating room by Dr. Doreatha Martin on for 03/18/2020 for the above procedures.  He tolerated this well without complications.  Was placed in a short leg splint postoperatively and instructed to be nonweightbearing on the left lower extremity.  Began working with physical and occupational therapy starting on postoperative day #1.  Was started on Lovenox for DVT prophylaxis starting on postoperative day #1.  The remainder of the patient's hospitalization was dedicated to achieving adequate pain control and increasing mobility. Venous U/S of the right lower extremity performed on POD #4 to rule out DVT was negative. On 03/22/2020, the patient was tolerating diet,  working well with therapies, pain well controlled, vital signs stable, dressings clean, dry, intact and felt stable for discharge to home. Patient will follow up as below and knows to call with questions or concerns.     Consults: None  Significant Diagnostic Studies:   Results for orders placed or performed during the hospital encounter of 03/17/20 (from the past 168 hour(s))  Basic metabolic panel   Collection Time: 03/17/20  4:54 PM  Result Value Ref Range   Sodium 140 135 - 145 mmol/L   Potassium 4.1 3.5 - 5.1 mmol/L   Chloride 104 98 - 111 mmol/L   CO2 24 22 - 32 mmol/L   Glucose, Bld 184 (H) 70 - 99 mg/dL   BUN 7 6 - 20 mg/dL   Creatinine, Ser 0.95 0.61 - 1.24 mg/dL   Calcium 9.2 8.9 - 10.3 mg/dL   GFR calc non Af Amer >60 >60 mL/min   GFR calc Af Amer >60 >60 mL/min   Anion gap 12 5 - 15  Respiratory Panel by RT PCR (Flu A&B, Covid) - Nasopharyngeal Swab   Collection Time: 03/17/20  5:08 PM   Specimen: Nasopharyngeal Swab  Result Value Ref Range   SARS Coronavirus 2 by RT PCR NEGATIVE NEGATIVE   Influenza A by PCR NEGATIVE NEGATIVE   Influenza B by PCR NEGATIVE NEGATIVE  Surgical pcr screen   Collection Time: 03/18/20  1:39 AM   Specimen: Nasal Mucosa; Nasal Swab  Result Value Ref Range   MRSA, PCR NEGATIVE NEGATIVE   Staphylococcus aureus POSITIVE (A) NEGATIVE  HIV Antibody (routine testing w rflx)   Collection Time: 03/18/20  5:08 AM  Result Value Ref Range   HIV  Screen 4th Generation wRfx NON REACTIVE NON REACTIVE  CBC   Collection Time: 03/18/20  3:45 PM  Result Value Ref Range   WBC 6.2 4.0 - 10.5 K/uL   RBC 4.26 4.22 - 5.81 MIL/uL   Hemoglobin 13.6 13.0 - 17.0 g/dL   HCT 80.3 21.2 - 24.8 %   MCV 95.1 80.0 - 100.0 fL   MCH 31.9 26.0 - 34.0 pg   MCHC 33.6 30.0 - 36.0 g/dL   RDW 25.0 03.7 - 04.8 %   Platelets 157 150 - 400 K/uL   nRBC 0.0 0.0 - 0.2 %  Creatinine, serum   Collection Time: 03/18/20  3:45 PM  Result Value Ref Range   Creatinine, Ser 0.77  0.61 - 1.24 mg/dL   GFR calc non Af Amer >60 >60 mL/min   GFR calc Af Amer >60 >60 mL/min  VITAMIN D 25 Hydroxy (Vit-D Deficiency, Fractures)   Collection Time: 03/18/20  6:33 PM  Result Value Ref Range   Vit D, 25-Hydroxy 13.11 (L) 30 - 100 ng/mL  Basic metabolic panel   Collection Time: 03/19/20  3:11 AM  Result Value Ref Range   Sodium 137 135 - 145 mmol/L   Potassium 3.9 3.5 - 5.1 mmol/L   Chloride 104 98 - 111 mmol/L   CO2 24 22 - 32 mmol/L   Glucose, Bld 171 (H) 70 - 99 mg/dL   BUN 6 6 - 20 mg/dL   Creatinine, Ser 8.89 0.61 - 1.24 mg/dL   Calcium 8.2 (L) 8.9 - 10.3 mg/dL   GFR calc non Af Amer >60 >60 mL/min   GFR calc Af Amer >60 >60 mL/min   Anion gap 9 5 - 15  CBC   Collection Time: 03/19/20  3:11 AM  Result Value Ref Range   WBC 9.2 4.0 - 10.5 K/uL   RBC 3.78 (L) 4.22 - 5.81 MIL/uL   Hemoglobin 12.2 (L) 13.0 - 17.0 g/dL   HCT 16.9 (L) 45.0 - 38.8 %   MCV 96.0 80.0 - 100.0 fL   MCH 32.3 26.0 - 34.0 pg   MCHC 33.6 30.0 - 36.0 g/dL   RDW 82.8 00.3 - 49.1 %   Platelets 158 150 - 400 K/uL   nRBC 0.0 0.0 - 0.2 %  Basic metabolic panel   Collection Time: 03/20/20  3:31 AM  Result Value Ref Range   Sodium 140 135 - 145 mmol/L   Potassium 3.6 3.5 - 5.1 mmol/L   Chloride 102 98 - 111 mmol/L   CO2 28 22 - 32 mmol/L   Glucose, Bld 116 (H) 70 - 99 mg/dL   BUN <5 (L) 6 - 20 mg/dL   Creatinine, Ser 7.91 0.61 - 1.24 mg/dL   Calcium 8.4 (L) 8.9 - 10.3 mg/dL   GFR calc non Af Amer >60 >60 mL/min   GFR calc Af Amer >60 >60 mL/min   Anion gap 10 5 - 15  CBC   Collection Time: 03/20/20  3:31 AM  Result Value Ref Range   WBC 7.7 4.0 - 10.5 K/uL   RBC 3.78 (L) 4.22 - 5.81 MIL/uL   Hemoglobin 12.2 (L) 13.0 - 17.0 g/dL   HCT 50.5 (L) 69.7 - 94.8 %   MCV 97.6 80.0 - 100.0 fL   MCH 32.3 26.0 - 34.0 pg   MCHC 33.1 30.0 - 36.0 g/dL   RDW 01.6 55.3 - 74.8 %   Platelets 154 150 - 400 K/uL   nRBC 0.0 0.0 - 0.2 %  Hemoglobin A1c   Collection Time: 03/20/20  3:31 AM  Result  Value Ref Range   Hgb A1c MFr Bld 5.4 4.8 - 5.6 %   Mean Plasma Glucose 108.28 mg/dL  Calcium, ionized   Collection Time: 03/20/20  3:31 AM  Result Value Ref Range   Calcium, Ionized, Serum 4.7 4.5 - 5.6 mg/dL  Basic metabolic panel   Collection Time: 03/21/20  3:17 AM  Result Value Ref Range   Sodium 140 135 - 145 mmol/L   Potassium 3.3 (L) 3.5 - 5.1 mmol/L   Chloride 99 98 - 111 mmol/L   CO2 30 22 - 32 mmol/L   Glucose, Bld 125 (H) 70 - 99 mg/dL   BUN <5 (L) 6 - 20 mg/dL   Creatinine, Ser 0.62 0.61 - 1.24 mg/dL   Calcium 8.8 (L) 8.9 - 10.3 mg/dL   GFR calc non Af Amer >60 >60 mL/min   GFR calc Af Amer >60 >60 mL/min   Anion gap 11 5 - 15     Treatments: IV hydration, antibiotics: Ancef, analgesia: acetaminophen, Vicodin and Morphine, anticoagulation: LMW heparin, therapies: PT and OT and surgery: IMN left tibia  Discharge Exam: General: Sitting up in bed, NAD Respiratory: No increased work of breathing LLE: Short leg splint in place. Bruising over posterior knee and posterior distal thigh. Mild/moderate tenderness with palpation in this area. Compartments of the thigh remain soft and compressible.  Able to wiggle toes. Sensation intact to light touch. Extremity warm  Disposition: Discharge disposition: 01-Home or Self Care        Allergies as of 03/22/2020   No Known Allergies     Medication List    STOP taking these medications   benzonatate 100 MG capsule Commonly known as: TESSALON   diphenoxylate-atropine 2.5-0.025 MG tablet Commonly known as: Lomotil   HYDROcodone-homatropine 5-1.5 MG/5ML syrup Commonly known as: HYCODAN   predniSONE 20 MG tablet Commonly known as: DELTASONE     TAKE these medications   aspirin EC 325 MG tablet Take 1 tablet (325 mg total) by mouth in the morning and at bedtime.   cetirizine 10 MG tablet Commonly known as: ZYRTEC Take 10 mg by mouth daily.   hydrochlorothiazide 12.5 MG capsule Commonly known as: MICROZIDE Take  12.5 mg by mouth daily.   HYDROcodone-acetaminophen 7.5-325 MG tablet Commonly known as: NORCO Take 1-2 tablets by mouth every 4 (four) hours as needed for severe pain.   methocarbamol 500 MG tablet Commonly known as: ROBAXIN Take 1 tablet (500 mg total) by mouth every 6 (six) hours as needed for muscle spasms.   metoprolol succinate 25 MG 24 hr tablet Commonly known as: TOPROL-XL Take 25 mg by mouth 2 (two) times daily.   ProAir HFA 108 (90 Base) MCG/ACT inhaler Generic drug: albuterol Inhale 1 puff into the lungs every 6 (six) hours as needed for shortness of breath.   Vitamin D 125 MCG (5000 UT) Caps Take 5,000 Units by mouth daily.            Durable Medical Equipment  (From admission, onward)         Start     Ordered   03/21/20 0903  For home use only DME Tub bench  Once     03/21/20 0903   03/19/20 1701  For home use only DME Bedside commode  Once    Question:  Patient needs a bedside commode to treat with the following condition  Answer:  Closed left tibial fracture  03/19/20 1701   03/19/20 1701  For home use only DME standard manual wheelchair with seat cushion  Once    Comments: Patient suffers from left tibia and fibula, obesity fracture which impairs their ability to perform daily activities like bathing, dressing, grooming and toileting in the home.  A cane or crutch will not resolve issue with performing activities of daily living. A wheelchair will allow patient to safely perform daily activities. Patient can safely propel the wheelchair in the home or has a caregiver who can provide assistance. Length of need 6 months . Accessories: elevating leg rests (ELRs), wheel locks, extensions and anti-tippers.   03/19/20 1701         Follow-up Information    Haddix, Gillie Manners, MD. Schedule an appointment as soon as possible for a visit in 2 week(s).   Specialty: Orthopedic Surgery Why: For splint removal and repeat x-rays Contact information: [address] [place] [address] [phone]        L[facility], [facility] Follow up.   Why: DME needs- BSC, tub bench and wheelchair arranged- to be delivered to room prior to discharge Contact information: [address] [place] [address] [phone]           Discharge Instructions and Plan: Patient will be discharged to home. Will be discharged on Aspirin 325 mg twice daily x 30 days for DVT prophylaxis.  We will continue to take vitamin D3 supplementation at discharge.  Patient has been provided with all the necessary DME for discharge. Patient will follow up with Dr. Jena Gauss in 2 weeks for repeat x-rays and suture/splint removal.   Signed:  Shawn Route. Ladonna Snide ?[phone]? (phone) 03/22/2020, 12:43 PM  [facility] [address] [place] [address] [phone] (F)

## 2020-03-21 NOTE — TOC Transition Note (Signed)
Transition of Care (TOC) - CM/SW Discharge Note Donn Pierini RN, BSN Transitions of Care Unit 4E- RN Case Manager (5N cross coverage) [phone]   Patient Details  Name: JALON SQUIER MRN: [id number] Date of Birth: 05/18/1971  Transition of Care [facility] CM/SW Contact:  Darrold Span, RN Phone Number: 03/21/2020, 2:32 PM   Clinical Narrative:    Pt stable for transition home today- DME has been ordered- spoke with Marisue Humble with [employer] regarding transition plan and DME needs [phone]) will fax orders to her via epic to fax#[phone] for authorization of DME. Per Marisue Humble can use in house DME agency- [facility] or call/bill to [facility] [phone]) Call made to [facility] with [employer] for DME needs- DME- BSC/tub bench and w/c to be delivered to the room prior to discharge.    Final next level of care: Home/Self Care Barriers to Discharge: No Barriers Identified   Patient Goals and CMS Choice Patient states their goals for this hospitalization and ongoing recovery are:: return home   Choice offered to / list presented to : NA  Discharge Placement               Home         Discharge Plan and Services                DME Arranged: Bedside commode, Tub bench, Wheelchair manual DME Agency: [facility] Date DME Agency Contacted: 03/21/20 Time DME Agency Contacted: 1432 Representative spoke with at DME Agency: Ian Malkin HH Arranged: NA HH Agency: NA        Social Determinants of Health (SDOH) Interventions     Readmission Risk Interventions Readmission Risk Prevention Plan 03/21/2020  Post Dischage Appt Complete  Medication Screening Complete  Transportation Screening Complete  Some recent data might be hidden

## 2020-03-21 NOTE — Plan of Care (Signed)
  Problem: Education: Goal: Knowledge of the prescribed therapeutic regimen will improve Outcome: Adequate for Discharge   

## 2020-03-21 NOTE — Progress Notes (Signed)
PT Cancellation Note  Patient Details Name: Cody Weiss MRN: [id number] DOB: 06-19-1971   Cancelled Treatment:    Reason Eval/Treat Not Completed: Other (comment) , patient reports waiting for DME( WC, tub bench), has been up in room ambulating. Declines PT at this time since he is DC. ....  Rada Hay 03/21/2020, 12:57 PM Blanchard Kelch PT Acute Rehabilitation Services Pager [phone] Office [phone]

## 2020-03-21 NOTE — Care Management Important Message (Signed)
Important Message  Patient Details  Name: Cody Weiss MRN: [id number] Date of Birth: [birth year]   Medicare Important Message Given:  Yes     Dorena Bodo 03/21/2020, 3:36 PM

## 2020-03-21 NOTE — Progress Notes (Signed)
Spoke with Cody Weiss at [employer]- they have been unable to get auth. For DME needed for discharge today. Per [employer] it may take several days to get auth. - attempted to call Cody Weiss with [employer] as she had informed the CM earlier she would be approving the auths needed for DME. Call went to VM- msg left. At this time of day unable to secure needed DME- call made to 5N spoke with RNAlcario Weiss and pt informed - pt will need to stay the night so that f/u can be done in the am with workers' comp and DME secured for a safe discharge home.

## 2020-03-22 ENCOUNTER — Inpatient Hospital Stay (HOSPITAL_COMMUNITY): Attending: Student

## 2020-03-22 DIAGNOSIS — Z9889 Other specified postprocedural states: Secondary | ICD-10-CM

## 2020-03-22 NOTE — Progress Notes (Signed)
Lower extremity venous has been completed.   Preliminary results in CV Proc.   Blanch Media 03/22/2020 11:40 AM

## 2020-03-22 NOTE — Progress Notes (Signed)
Physical Therapy Treatment Patient Details Name: Cody Weiss MRN: [id number] DOB: 1971-05-13 Today's Date: 03/22/2020    History of Present Illness Pt is a 49 y/o male s/p IM nail and ORIF L tib-fib fx secondary to a work-related injury. PMH including but not limited to HTN.    PT Comments    Pt supine in bed, he remains eager to return home.  Focused on progression to stair training this session.  He continues to fatigue quickly and presents with decreased strength in B UEs to lift body weight to next stair.  He required hopping motion to clear stair.  Pt presents with poor foot clearance when he starts to fatigue and required cues for pacing and rest breaks.  No PT follow up remains appropriate with DME listed below.      Follow Up Recommendations  No PT follow up;Supervision for mobility/OOB     Equipment Recommendations  3in1 (PT);Wheelchair (measurements PT);Wheelchair cushion (measurements PT);Other (comment)    Recommendations for Other Services       Precautions / Restrictions Precautions Precautions: Fall Precaution Comments: Pt moves quickly and is a bit impulsive  Restrictions Weight Bearing Restrictions: Yes LLE Weight Bearing: Weight bearing as tolerated    Mobility  Bed Mobility Overal bed mobility: Modified Independent                Transfers Overall transfer level: Needs assistance Equipment used: Rolling walker (2 wheeled) Transfers: Sit to/from Stand Sit to Stand: Supervision Stand pivot transfers: Supervision       General transfer comment: Cues for sequencing and safety.  Good ability to maintain weight bearing.  Ambulation/Gait Ambulation/Gait assistance: Min guard Gait Distance (Feet): 100 Feet Assistive device: Rolling walker (2 wheeled) Gait Pattern/deviations: Step-to pattern(hop to)     General Gait Details: Pt cued for safety with RW to keep device close to his body.  Pt required cues to stop when fatigued.  When he fatigues he  presents with poor foot clearance.   Stairs Stairs: Yes Stairs assistance: Min assist Stair Management: One rail Right;Step to pattern;Backwards;Forwards Number of Stairs: 2 General stair comments: Backwards to descend, forward to ascend.  He was very reluctant to trust walker with bracing so he did hold to rail during 2nd stair attempt.   Wheelchair Mobility    Modified Rankin (Stroke Patients Only)       Balance Overall balance assessment: Needs assistance Sitting-balance support: Feet supported Sitting balance-Leahy Scale: Normal       Standing balance-Leahy Scale: Fair Standing balance comment: Reliant on UE support.                            Cognition Arousal/Alertness: Awake/alert Behavior During Therapy: WFL for tasks assessed/performed;Impulsive Overall Cognitive Status: Within Functional Limits for tasks assessed                                        Exercises      General Comments        Pertinent Vitals/Pain Pain Assessment: Faces Faces Pain Scale: Hurts even more Pain Location: L knee/LE Pain Descriptors / Indicators: Aching Pain Intervention(s): Monitored during session;Repositioned    Home Living                      Prior Function  PT Goals (current goals can now be found in the care plan section) Acute Rehab PT Goals Patient Stated Goal: to get back to normal  Potential to Achieve Goals: Good Progress towards PT goals: Progressing toward goals    Frequency    Min 3X/week      PT Plan Current plan remains appropriate    Co-evaluation              AM-PAC PT "6 Clicks" Mobility   Outcome Measure  Help needed turning from your back to your side while in a flat bed without using bedrails?: None Help needed moving from lying on your back to sitting on the side of a flat bed without using bedrails?: None Help needed moving to and from a bed to a chair (including a  wheelchair)?: A Little Help needed standing up from a chair using your arms (e.g., wheelchair or bedside chair)?: A Little Help needed to walk in hospital room?: A Little Help needed climbing 3-5 steps with a railing? : A Little 6 Click Score: 20    End of Session Equipment Utilized During Treatment: Gait belt Activity Tolerance: Patient tolerated treatment well Patient left: in chair;with call bell/phone within reach;with chair alarm set;with family/visitor present Nurse Communication: Mobility status PT Visit Diagnosis: Other abnormalities of gait and mobility (R26.89)     Time: 0258-5277 PT Time Calculation (min) (ACUTE ONLY): 15 min  Charges:  $Gait Training: 8-22 mins                     Cody Weiss , PTA Acute Rehabilitation Services Pager [phone] Office [phone]     Cody Weiss 03/22/2020, 10:47 AM

## 2020-03-22 NOTE — TOC Transition Note (Signed)
Transition of Care [facility]) - CM/SW Discharge Note   Patient Details  Name: Cody Weiss MRN: [id number] Date of Birth: [birth year]  Transition of Care [facility]) CM/SW Contact:  Epifanio Lesches, RN Phone Number: [phone] 03/22/2020, 4:05 PM   Clinical Narrative:    Pt will transition to home today. [employer] will deliver DME (  Bedside commode, Tub bench, Wheelchair manual ) to bedside prior to d/c. Sister to provide transportation to home.  [name] (Sister)      [phone]        Final next level of care: Home/Self Care Barriers to Discharge: No Barriers Identified   Patient Goals and CMS Choice Patient states their goals for this hospitalization and ongoing recovery are:: return home   Choice offered to / list presented to : NA  Discharge Placement   Discharge Plan and Services                DME Arranged: Bedside commode, Tub bench, Wheelchair manual DME Agency: [employer] Date DME Agency Contacted: 03/21/20 Time DME Agency Contacted: 1432 Representative spoke with at DME Agency: Ian Malkin HH Arranged: NA HH Agency: NA        Social Determinants of Health (SDOH) Interventions     Readmission Risk Interventions Readmission Risk Prevention Plan 03/21/2020  Post Dischage Appt Complete  Medication Screening Complete  Transportation Screening Complete  Some recent data might be hidden

## 2020-03-22 NOTE — Progress Notes (Signed)
Orthopaedic Trauma Progress Note  S: Doing okay today.  Moving around better with the walker.  Ready to go home.  O:  Vitals:   03/22/20 0334 03/22/20 0738  BP: 127/87 138/88  Pulse: 63 70  Resp: 17 16  Temp:  98.8 F (37.1 C)  SpO2: 96% 98%   General: Sitting up in bed, NAD Respiratory: No increased work of breathing LLE: Short leg splint in place. Bruising over posterior knee and posterior distal thigh. Mild/moderate tenderness with palpation in this area. Compartments of the thigh remain soft and compressible.  Able to wiggle toes. Sensation intact to light touch. Extremity warm   Imaging: Stable post op imaging.   Labs: No results found for this or any previous visit (from the past 24 hour(s)).  Assessment: 49 year old male status post work-related injury, 4 Days Post-Op   Injuries: 1. Left segmental tibial shaft fracture s/p intramedullary nailing 2.  Left segmental fibula fracture s/p ORIF of lateral malleolus 3.  Left syndesmosis disruption s/p ORIF  Weightbearing: NWB LLE  Insicional and dressing care: Dressings left intact until follow-up   Showering: Okay to begin showering.  Keep splint dry  Orthopedic device(s): Splint LLE  CV/Blood loss: Hemoglobin stable. Hemodynamically stable  Pain management: Continue current regimen  VTE prophylaxis: Lovenox.  Will be discharged on aspirin 325 mg twice daily  ID:  Ancef 2gm post op completed  Foley/Lines:  No foley, KVO IVFs  Medical co-morbidities: Hypertension, tobacco use  Impediments to Fracture Healing: Tobacco use.  Vitamin D level 13 continue vitamin D3 supplementation  Dispo: Up with therapies as tolerated.  Plan to obtain ultrasound of left lower extremity today to rule out DVT.  Plan for discharge once DME is delivered   Follow - up plan: 2 weeks for splint and suture removal as well as repeat imaging  Contact information:  Truitt Merle MD, Ulyses Southward PA-C   [facility]zaiah Licciardi A. [facility] [phone] (office) [email or web address]

## 2021-04-20 IMAGING — DX DG TIBIA/FIBULA PORT 2V*L*
3 series · 3 of 3 positions shown · non-contrast
Comparison: 03/18/2020.

CLINICAL DATA: Postop exam left leg.

EXAM:
PORTABLE LEFT TIBIA AND FIBULA - 2 VIEW

[tibia lat (1 of 2)]
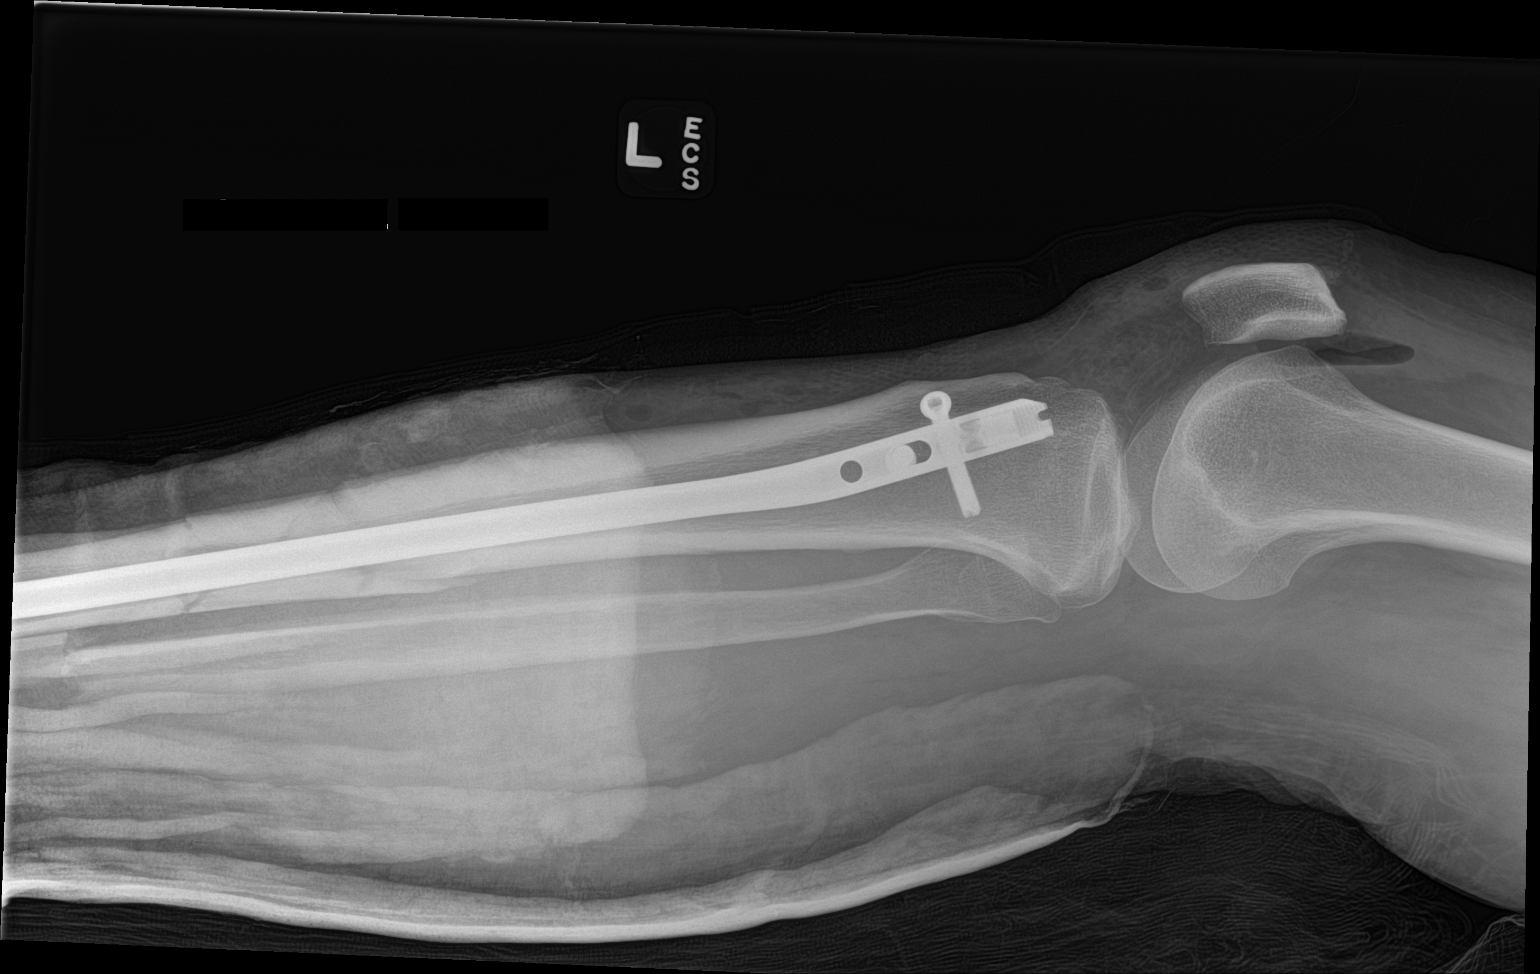

[tibia lat (2 of 2)]
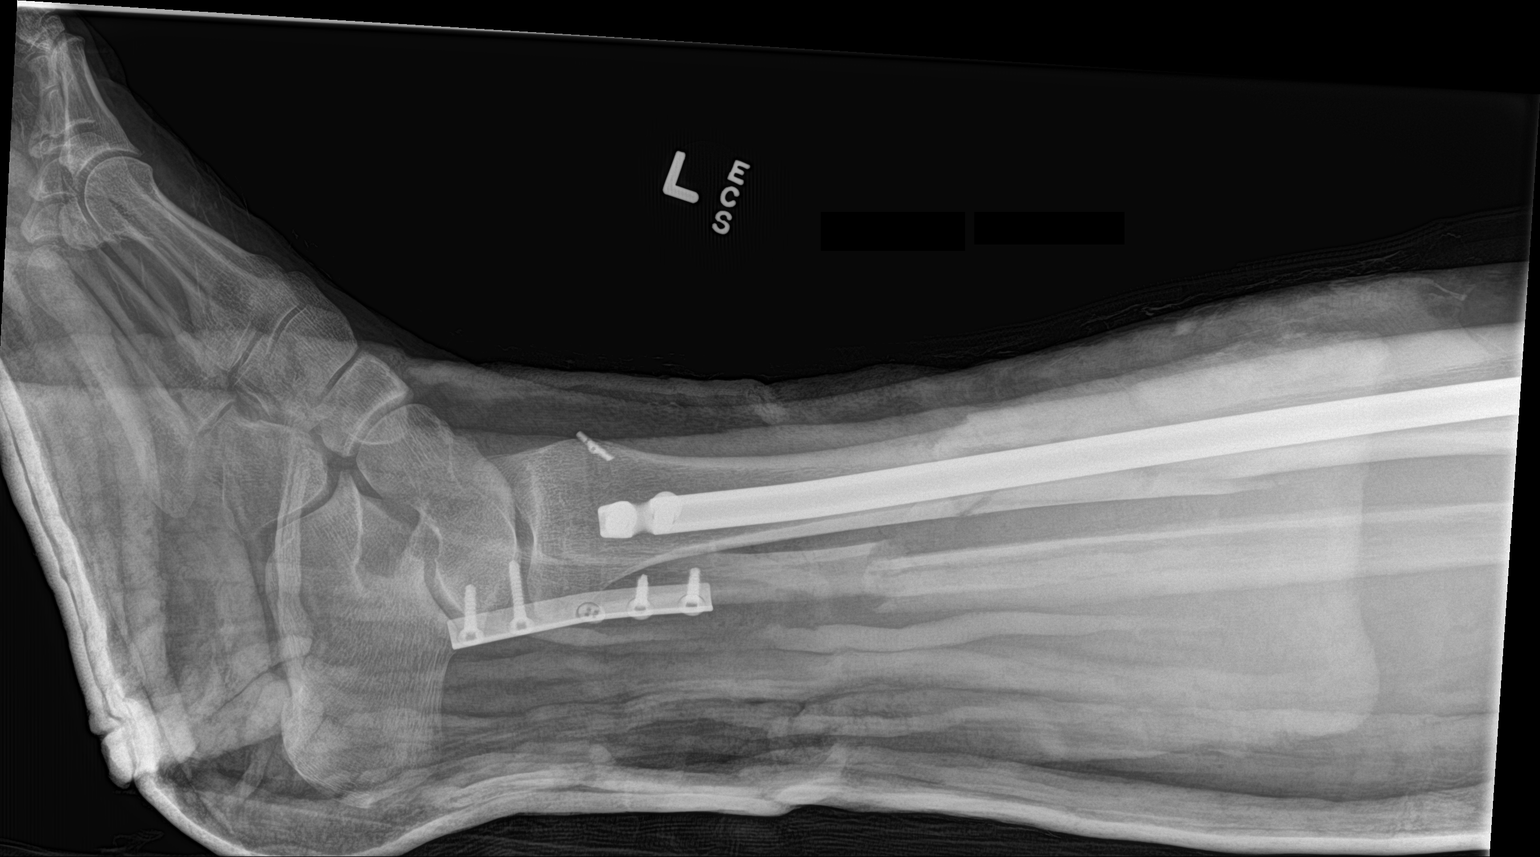

[tibia ap]
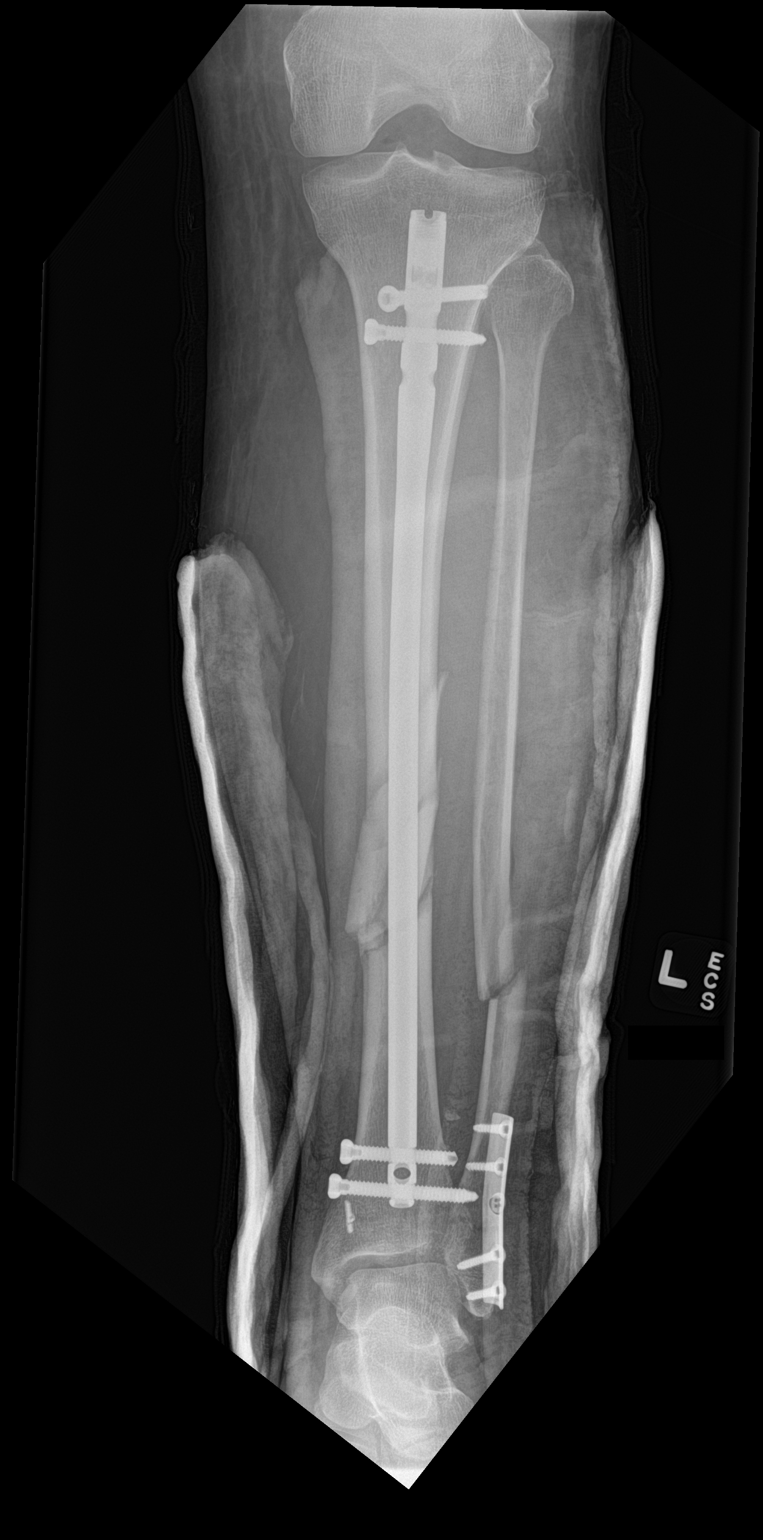

[3 of 3 positions shown; findings below may reference images not displayed]

FINDINGS: ORIF left tibia and fibula. Mid tibial and fibular fractures noted.
Patient is in a splint.
IMPRESSION: ORIF left tibia and fibula.

## 2022-02-15 ENCOUNTER — Ambulatory Visit
Admission: EM | Admit: 2022-02-15 | Discharge: 2022-02-15 | Disposition: A | Discharge status: Home / Self Care | Payer: No Typology Code available for payment source

## 2022-02-15 ENCOUNTER — Encounter: Payer: Self-pay | Admitting: Emergency Medicine

## 2022-02-15 ENCOUNTER — Other Ambulatory Visit: Payer: Self-pay

## 2022-02-15 DIAGNOSIS — K625 Hemorrhage of anus and rectum: Secondary | ICD-10-CM

## 2022-02-15 NOTE — ED Provider Notes (Signed)
?[facility] ? ? ? ?CSN: [id number] ?Arrival date & time: 02/15/22  1638 ? ? ?  ? ?History   ?Chief Complaint ?Chief Complaint  ?Patient presents with  ? Rectal Bleeding  ? ? ?HPI ?Cody Weiss is a 51 y.o. male.  ? ?Patient presents with bleeding from his rectum that has been present for approximately 4 to 5 days.  Patient reports that bleeding was heavy and he was bleeding through underwear and pants when symptoms first started but is now lightened up.  Blood is dark red in color.  He also reports that he has been very weak since symptoms started.  Denies any nausea or vomiting.  Denies diarrhea or constipation.  Denies any abdominal pain. ? ? ?Rectal Bleeding ? ?Past Medical History:  ?Diagnosis Date  ? Bronchitis   ? Hypertension   ? Pneumonia 10/2016  ? ? ?Patient Active Problem List  ? Diagnosis Date Noted  ? Ankle syndesmosis disruption, left, initial encounter 03/21/2020  ? Displaced segmental fracture of shaft of left fibula, initial encounter for closed fracture 03/21/2020  ? Hypertension   ? Closed displaced segmental fracture of shaft of left tibia 03/17/2020  ? CAP (community acquired pneumonia) 11/14/2016  ? Acute bronchitis 11/14/2016  ? ? ?Past Surgical History:  ?Procedure Laterality Date  ? HAND SURGERY    ? HERNIA REPAIR    ? ORIF ANKLE FRACTURE Left 03/18/2020  ? Procedure: OPEN REDUCTION INTERNAL FIXATION (ORIF) ANKLE FRACTURE;  Surgeon: Roby Lofts, MD;  Location: [facility] OR;  Service: Orthopedics;  Laterality: Left;  ? TIBIA IM NAIL INSERTION Left 03/18/2020  ? Procedure: INTRAMEDULLARY (IM) NAIL TIBIAL;  Surgeon: Roby Lofts, MD;  Location: [facility] OR;  Service: Orthopedics;  Laterality: Left;  ? ? ? ? ? ?Home Medications   ? ?Prior to Admission medications   ?Medication Sig Start Date End Date Taking? Authorizing Provider  ?cetirizine (ZYRTEC) 10 MG tablet Take 10 mg by mouth daily. 12/28/19   [provider]  ?Cholecalciferol (VITAMIN D) 125 MCG (5000 UT) CAPS Take 5,000 Units  by mouth daily. 03/21/20   West Bali, PA-C  ?hydrochlorothiazide (MICROZIDE) 12.5 MG capsule Take 12.5 mg by mouth daily. 03/15/20   [provider]  ?HYDROcodone-acetaminophen (NORCO) 7.5-325 MG tablet Take 1-2 tablets by mouth every 4 (four) hours as needed for severe pain. 03/18/20   West Bali, PA-C  ?methocarbamol (ROBAXIN) 500 MG tablet Take 1 tablet (500 mg total) by mouth every 6 (six) hours as needed for muscle spasms. 03/18/20   West Bali, PA-C  ?metoprolol succinate (TOPROL-XL) 25 MG 24 hr tablet Take 25 mg by mouth 2 (two) times daily. 02/23/20   [provider]  ?PROAIR HFA 108 (90 Base) MCG/ACT inhaler Inhale 1 puff into the lungs every 6 (six) hours as needed for shortness of breath. 01/18/20   [provider]  ? ? ?Family History ?History reviewed. No pertinent family history. ? ?Social History ?Social History  ? ?Tobacco Use  ? Smoking status: Never  ? Smokeless tobacco: Current  ?  Types: Snuff  ?Substance Use Topics  ? Alcohol use: Yes  ?  Comment: occ  ? Drug use: No  ? ? ? ?Allergies   ?Patient has no known allergies. ? ? ?Review of Systems ?Review of Systems  ?Gastrointestinal:  Positive for hematochezia.  ?Per HPI ? ?Physical Exam ?Triage Vital Signs ?ED Triage Vitals  ?Enc Vitals Group  ?   BP 02/15/22 1703 Marland Kitchen)  160/103  ?   Pulse Rate 02/15/22 1703 100  ?   Resp 02/15/22 1703 16  ?   Temp 02/15/22 1703 98 ?F (36.7 ?C)  ?   Temp Source 02/15/22 1703 Oral  ?   SpO2 02/15/22 1703 94 %  ?   Weight --   ?   Height --   ?   Head Circumference --   ?   Peak Flow --   ?   Pain Score 02/15/22 1704 0  ?   Pain Loc --   ?   Pain Edu? --   ?   Excl. in GC? --   ? ?No data found. ? ?Updated Vital Signs ?BP (!) 160/103 (BP Location: Left Arm)   Pulse 100   Temp 98 ?F (36.7 ?C) (Oral)   Resp 16   SpO2 94%  ? ?Visual Acuity ?Right Eye Distance:   ?Left Eye Distance:   ?Bilateral Distance:   ? ?Right Eye Near:   ?Left Eye Near:    ?Bilateral Near:    ? ?Physical  Exam ?Constitutional:   ?   General: He is not in acute distress. ?   Appearance: Normal appearance. He is not toxic-appearing or diaphoretic.  ?HENT:  ?   Head: Normocephalic and atraumatic.  ?Eyes:  ?   Extraocular Movements: Extraocular movements intact.  ?   Conjunctiva/sclera: Conjunctivae normal.  ?Cardiovascular:  ?   Rate and Rhythm: Normal rate and regular rhythm.  ?   Pulses: Normal pulses.  ?   Heart sounds: Normal heart sounds.  ?Pulmonary:  ?   Effort: Pulmonary effort is normal. No respiratory distress.  ?   Breath sounds: Normal breath sounds.  ?Abdominal:  ?   General: Bowel sounds are normal. There is no distension.  ?   Palpations: Abdomen is soft.  ?   Tenderness: There is no abdominal tenderness.  ?Neurological:  ?   General: No focal deficit present.  ?   Mental Status: He is alert and oriented to person, place, and time. Mental status is at baseline.  ?Psychiatric:     ?   Mood and Affect: Mood normal.     ?   Behavior: Behavior normal.     ?   Thought Content: Thought content normal.     ?   Judgment: Judgment normal.  ? ? ? ?UC Treatments / Results  ?Labs ?(all labs ordered are listed, but only abnormal results are displayed) ?Labs Reviewed - No data to display ? ?EKG ? ? ?Radiology ?No results found. ? ?Procedures ?Procedures (including critical care time) ? ?Medications Ordered in UC ?Medications - No data to display ? ?Initial Impression / Assessment and Plan / UC Course  ?I have reviewed the triage vital signs and the nursing notes. ? ?Pertinent labs & imaging results that were available during my care of the patient were reviewed by me and considered in my medical decision making (see chart for details). ? ?  ? ?In the setting of diffuse rectal bleeding and weakness, patient was advised to go to the hospital for further evaluation and management as he needs a more extensive evaluation than can be provided at the urgent care.  Patient was agreeable with plan.  Vital signs stable at  discharge.  Patient left via self transport. ?Final Clinical Impressions(s) / UC Diagnoses  ? ?Final diagnoses:  ?Rectal bleeding  ? ? ? ?Discharge Instructions   ? ?  ?Go to the emergency department as soon  as you leave urgent care for further evaluation and management. ? ? ? ?ED Prescriptions   ?None ?  ? ?PDMP not reviewed this encounter. ?  ?Gustavus Bryant, [place] ?02/15/22 1719 ? ?

## 2022-02-15 NOTE — ED Triage Notes (Addendum)
"  I got a little bit of blood around my butt." On-going since Sunday. Said he bled through his underwear, pants, and shorts. Says it has slowed since Sunday. No hx of colonoscopy. Small amount of associated pain ?

## 2022-02-15 NOTE — Discharge Instructions (Signed)
Go to the emergency department as soon as you leave urgent care for further evaluation and management. 

## 2022-02-16 ENCOUNTER — Emergency Department (HOSPITAL_COMMUNITY)
Admission: EM | Admit: 2022-02-16 | Discharge: 2022-02-16 | Disposition: A | Discharge status: Home / Self Care | Payer: Self-pay | Attending: Emergency Medicine | Admitting: Emergency Medicine

## 2022-02-16 ENCOUNTER — Encounter (HOSPITAL_COMMUNITY): Payer: Self-pay | Admitting: *Deleted

## 2022-02-16 ENCOUNTER — Emergency Department (HOSPITAL_COMMUNITY): Payer: Self-pay

## 2022-02-16 ENCOUNTER — Other Ambulatory Visit: Payer: Self-pay

## 2022-02-16 DIAGNOSIS — Z79899 Other long term (current) drug therapy: Secondary | ICD-10-CM | POA: Insufficient documentation

## 2022-02-16 DIAGNOSIS — E876 Hypokalemia: Secondary | ICD-10-CM | POA: Insufficient documentation

## 2022-02-16 DIAGNOSIS — I10 Essential (primary) hypertension: Secondary | ICD-10-CM | POA: Insufficient documentation

## 2022-02-16 DIAGNOSIS — K644 Residual hemorrhoidal skin tags: Secondary | ICD-10-CM | POA: Insufficient documentation

## 2022-02-16 DIAGNOSIS — R1032 Left lower quadrant pain: Secondary | ICD-10-CM | POA: Insufficient documentation

## 2022-02-16 DIAGNOSIS — E878 Other disorders of electrolyte and fluid balance, not elsewhere classified: Secondary | ICD-10-CM | POA: Insufficient documentation

## 2022-02-16 DIAGNOSIS — R791 Abnormal coagulation profile: Secondary | ICD-10-CM | POA: Insufficient documentation

## 2022-02-16 LAB — CBC WITH DIFFERENTIAL/PLATELET
Abs Immature Granulocytes: 0.06 10*3/uL (ref 0.00–0.07)
Basophils Absolute: 0.1 10*3/uL (ref 0.0–0.1)
Basophils Relative: 1 %
Eosinophils Absolute: 0.2 10*3/uL (ref 0.0–0.5)
Eosinophils Relative: 3 %
HCT: 48.9 % (ref 39.0–52.0)
Hemoglobin: 15.4 g/dL (ref 13.0–17.0)
Immature Granulocytes: 1 %
Lymphocytes Relative: 31 %
Lymphs Abs: 2.4 10*3/uL (ref 0.7–4.0)
MCH: 29.3 pg (ref 26.0–34.0)
MCHC: 31.5 g/dL (ref 30.0–36.0)
MCV: 93 fL (ref 80.0–100.0)
Monocytes Absolute: 0.9 10*3/uL (ref 0.1–1.0)
Monocytes Relative: 11 %
Neutro Abs: 4.2 10*3/uL (ref 1.7–7.7)
Neutrophils Relative %: 53 %
Platelets: 186 10*3/uL (ref 150–400)
RBC: 5.26 MIL/uL (ref 4.22–5.81)
RDW: 13.9 % (ref 11.5–15.5)
WBC: 7.7 10*3/uL (ref 4.0–10.5)
nRBC: 0 % (ref 0.0–0.2)

## 2022-02-16 LAB — BASIC METABOLIC PANEL
Anion gap: 10 (ref 5–15)
BUN: <5 mg/dL — ABNORMAL LOW (ref 6–20)
CO2: 29 mmol/L (ref 22–32)
Calcium: 8.3 mg/dL — ABNORMAL LOW (ref 8.9–10.3)
Chloride: 95 mmol/L — ABNORMAL LOW (ref 98–111)
Creatinine, Ser: 0.82 mg/dL (ref 0.61–1.24)
GFR, Estimated: >60 mL/min (ref >60)
Glucose, Bld: 174 mg/dL — ABNORMAL HIGH (ref 70–99)
Potassium: 3.6 mmol/L (ref 3.5–5.1)
Sodium: 134 mmol/L — ABNORMAL LOW (ref 135–145)

## 2022-02-16 LAB — PROTIME-INR
INR: 1.6 — ABNORMAL HIGH (ref 0.8–1.2)
Prothrombin Time: 18.9 seconds — ABNORMAL HIGH (ref 11.4–15.2)

## 2022-02-16 LAB — POC OCCULT BLOOD, ED: Fecal Occult Bld: POSITIVE — AB

## 2022-02-16 LAB — TYPE AND SCREEN
ABO/RH(D): O POS
Antibody Screen: NEGATIVE

## 2022-02-16 MED ORDER — SODIUM CHLORIDE 0.9 % IV BOLUS: 500.0000 mL | Freq: Once | INTRAVENOUS | Status: DC

## 2022-02-16 MED ORDER — HYDROCHLOROTHIAZIDE 12.5 MG PO TABS: 12.5000 mg | ORAL_TABLET | Freq: Every day | ORAL | Status: DC

## 2022-02-16 MED ORDER — IOHEXOL 350 MG/ML SOLN
80.0000 mL | Freq: Once | INTRAVENOUS | Status: AC | PRN
  Administered 2022-02-16: 80 mL via INTRAVENOUS

## 2022-02-16 NOTE — ED Provider Notes (Signed)
?[facility] EMERGENCY DEPARTMENT ?Provider Note ? ? ?CSN: [id number] ?Arrival date & time: 02/16/22  0907 ? ?  ? ?History ? ?Chief Complaint  ?Patient presents with  ? Rectal Bleeding  ? ? ?Cody Weiss is a 51 y.o. male with a past medical history of hypertension and diverticulitis presenting today with rectal bleeding.  He reports that on Sunday or Monday he started to notice dark blood on the inside of his underwear.  The bleeding continued to get heavier and would nearly saturate the tissue when he used the bathroom.  Denies any diarrhea or constipation.  Says that the blood appears to be mixed in with his stool.  Denies abdominal pain.  Reports history of hernia however no other abdominal surgeries.  No history of IBS/IBD, colorectal cancer.  Denies any cancer history in his family.  Is not on a blood thinner.  Denies excessive NSAID use however reports that he drinks 6-7 beers every day.  Has never seen a gastroenterologist.  Has not had a colonoscopy. ? ? ?Home Medications ?Prior to Admission medications   ?Medication Sig Start Date End Date Taking? Authorizing Provider  ?cetirizine (ZYRTEC) 10 MG tablet Take 10 mg by mouth daily. 12/28/19   [provider]  ?Cholecalciferol (VITAMIN D) 125 MCG (5000 UT) CAPS Take 5,000 Units by mouth daily. 03/21/20   West Bali, PA-C  ?hydrochlorothiazide (MICROZIDE) 12.5 MG capsule Take 12.5 mg by mouth daily. 03/15/20   [provider]  ?HYDROcodone-acetaminophen (NORCO) 7.5-325 MG tablet Take 1-2 tablets by mouth every 4 (four) hours as needed for severe pain. 03/18/20   West Bali, PA-C  ?methocarbamol (ROBAXIN) 500 MG tablet Take 1 tablet (500 mg total) by mouth every 6 (six) hours as needed for muscle spasms. 03/18/20   West Bali, PA-C  ?metoprolol succinate (TOPROL-XL) 25 MG 24 hr tablet Take 25 mg by mouth 2 (two) times daily. 02/23/20   [provider]  ?PROAIR HFA 108 (90 Base) MCG/ACT inhaler Inhale 1 puff into  the lungs every 6 (six) hours as needed for shortness of breath. 01/18/20   [provider]  ?   ? ?Allergies    ?Patient has no known allergies.   ? ?Review of Systems   ?Review of Systems ?See HPI ? ?Physical Exam ?Updated Vital Signs ?BP (!) 153/102 (BP Location: Right Arm)   Pulse 88   Temp 98.1 ?F (36.7 ?C)   Resp 16   SpO2 95%  ?Physical Exam ?Vitals and nursing note reviewed. Exam conducted with a chaperone present.  ?Constitutional:   ?   Appearance: Normal appearance.  ?HENT:  ?   Head: Normocephalic and atraumatic.  ?Eyes:  ?   General: No scleral icterus. ?   Conjunctiva/sclera: Conjunctivae normal.  ?Pulmonary:  ?   Effort: Pulmonary effort is normal. No respiratory distress.  ?Abdominal:  ?   General: There is distension.  ?   Tenderness: There is no guarding.  ?   Hernia: A hernia (Reproducible periumbilical) is present.  ?Genitourinary: ?   Rectum: Guaiac result positive. Tenderness and external hemorrhoid present. No anal fissure or internal hemorrhoid.  ? ? ?   Comments: 1 external thrombosed hemorrhoid.  Tender to palpation. ?Skin: ?   Findings: No rash.  ?Neurological:  ?   Mental Status: He is alert.  ?Psychiatric:     ?   Mood and Affect: Mood normal.  ? ? ?ED Results / Procedures / Treatments   ?Labs ?(  all labs ordered are listed, but only abnormal results are displayed) ?Labs Reviewed  ?BASIC METABOLIC PANEL - Abnormal; Notable for the following components:  ?    Result Value  ? Sodium 134 (*)   ? Chloride 95 (*)   ? Glucose, Bld 174 (*)   ? BUN <5 (*)   ? Calcium 8.3 (*)   ? All other components within normal limits  ?PROTIME-INR - Abnormal; Notable for the following components:  ? Prothrombin Time 18.9 (*)   ? INR 1.6 (*)   ? All other components within normal limits  ?POC OCCULT BLOOD, ED - Abnormal; Notable for the following components:  ? Fecal Occult Bld POSITIVE (*)   ? All other components within normal limits  ?CBC WITH DIFFERENTIAL/PLATELET  ?TYPE AND SCREEN  ?ABO/RH   ? ? ?EKG ?None ? ?Radiology ?No results found. ? ?Procedures ?Procedures  ? ?Medications Ordered in ED ?Medications  ?hydrochlorothiazide (HYDRODIURIL) tablet 12.5 mg (has no administration in time range)  ? ? ?ED Course/ Medical Decision Making/ A&P ?  ?                        ?Medical Decision Making ?Amount and/or Complexity of Data Reviewed ?Labs: ordered. ?Radiology: ordered. ? ?Risk ?Prescription drug management. ? ? ?51 year old male presenting with rectal bleeding.  Differential includes but is not limited to hemorrhoids, anal fissure, anal fistula, colorectal cancer, IBS/IBD, coagulopathy, PUD and diverticulitis. ? ?PE: Patient's abdomen is distended.  Reducible periumbilical hernia.  Patient was with mild tenderness to palpation in lower left quadrant and epigastrium.  He does have a history of diverticulitis although I am unable to find this in his chart review. ? ? ?Lab Tests: ? ?I ordered and viewed labs. The pertinent results include:  ?Positive fecal occult ?INR 1.6 ?Mild hypokalemia and hypochloremia ? ?Imaging Studies ordered: ? ?I ordered and individually viewed CT scan and noted no abnormalities.  Radiologist read without acute abnormalities.  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 ? ?Treatment: ? ?Medications: ? ?I ordered a fluid bolus and HCTZ for patient's blood pressure. ? ? ?Dispo: ? ?Problem List / ED Course: ? ?Thrombosed, bleeding hemorrhoid.  I suspect the blood he is noting is coming from the hemorrhoid.  Hemoccult was positive however potentially could be from the external blood despite my attempt at a clean exam.  He will be referred to both GI and general surgery.  Surgery may assist him with his hernia and potentially excise his hemorrhoid. ? ?GI is a good option for his hematochezia/melena.  He voiced understanding.  He was given resources and information at home treatment of hemorrhoids.  Will be discharged home at this time.  He and his wife are agreeable. ? ?Final Clinical  Impression(s) / ED Diagnoses ?Final diagnoses:  ?External hemorrhoid  ? ? ?Rx / DC Orders ?Results and diagnoses were explained to the patient. Return precautions discussed in full. Patient had no additional questions and expressed complete understanding. ? ? ?This chart was dictated using voice recognition software.  Despite best efforts to proofread,  errors can occur which can change the documentation meaning.  ? ? ?  ?Saddie Benders, PA-C ?02/16/22 1839 ? ?  ?Pollyann Savoy, MD ?02/18/22 [phone] ? ?

## 2022-02-16 NOTE — ED Triage Notes (Signed)
Pt reports having rectal bleeding since Sunday or Monday. Reports it was initially dark but has become light, had n/v last night and generalized weakness.  ?

## 2022-02-16 NOTE — Discharge Instructions (Addendum)
Please follow-up with the surgeon attached to these discharge papers to be further evaluated about your hernia.  You may also speak with him about your hemorrhoids if they do not resolve. ? ?Additionally, if you continue to have blood in your stools, there is a GI provider attached to this discharge papers.  Call them to make an appointment. ?

## 2022-02-16 NOTE — ED Provider Triage Note (Signed)
Emergency Medicine Provider Triage Evaluation Note ? ?Cody Weiss , a 51 y.o. male  was evaluated in triage.  Pt complains of rectal bleeding.  He says since Sunday or Monday he has noted blood on the tissue when he wipes.  Says that this is occasionally accompanied by pain.  Says he has felt weak and tired since this began.  No history of GI bleeding. ? ?When asked about his alcohol use he first that he only drinks occasionally however then reported that he drinks 6-7 beers daily.  Denies any NSAID use.  Is not on blood thinners.  No history of IBS/IBD/colorectal cancer. ? ?Review of Systems  ?Positive: See above ?Negative: Abdominal pain, nausea or vomiting ? ?Physical Exam  ?BP (!) 149/101 (BP Location: Right Arm)   Pulse 98   Temp 98.4 ?F (36.9 ?C) (Oral)   Resp 18   SpO2 99%  ?Gen:   Awake, no distress   ?Resp:  Normal effort  ?MSK:   Moves extremities without difficulty  ?Other:  Mild epigastric tenderness, abdomen distended ? ?Medical Decision Making  ?Medically screening exam initiated at 9:26 AM.  Appropriate orders placed.  Cody Weiss was informed that the remainder of the evaluation will be completed by another provider, this initial triage assessment does not replace that evaluation, and the importance of remaining in the ED until their evaluation is complete. ? ? ?Sent by urgent care, GI bleed versus hemorrhoids versus fissure.  Urgent care did not do exam. ?  Rhae Hammock, PA-C ?02/16/22 [id number] ? ?

## 2022-08-10 ENCOUNTER — Emergency Department (HOSPITAL_COMMUNITY): Payer: Medicaid Other

## 2022-08-10 ENCOUNTER — Emergency Department (HOSPITAL_COMMUNITY)
Admission: EM | Admit: 2022-08-10 | Discharge: 2022-08-10 | Disposition: A | Discharge status: Home / Self Care | Payer: Medicaid Other | Attending: Emergency Medicine | Admitting: Emergency Medicine

## 2022-08-10 ENCOUNTER — Encounter (HOSPITAL_COMMUNITY): Payer: Self-pay | Admitting: Emergency Medicine

## 2022-08-10 ENCOUNTER — Other Ambulatory Visit: Payer: Self-pay

## 2022-08-10 DIAGNOSIS — Z79899 Other long term (current) drug therapy: Secondary | ICD-10-CM | POA: Insufficient documentation

## 2022-08-10 DIAGNOSIS — Z20822 Contact with and (suspected) exposure to covid-19: Secondary | ICD-10-CM | POA: Insufficient documentation

## 2022-08-10 DIAGNOSIS — F17200 Nicotine dependence, unspecified, uncomplicated: Secondary | ICD-10-CM | POA: Insufficient documentation

## 2022-08-10 DIAGNOSIS — I10 Essential (primary) hypertension: Secondary | ICD-10-CM | POA: Insufficient documentation

## 2022-08-10 DIAGNOSIS — J4521 Mild intermittent asthma with (acute) exacerbation: Secondary | ICD-10-CM | POA: Insufficient documentation

## 2022-08-10 LAB — CBC
HCT: 50.3 % (ref 39.0–52.0)
Hemoglobin: 16.1 g/dL (ref 13.0–17.0)
MCH: 29.1 pg (ref 26.0–34.0)
MCHC: 32 g/dL (ref 30.0–36.0)
MCV: 90.8 fL (ref 80.0–100.0)
Platelets: 216 10*3/uL (ref 150–400)
RBC: 5.54 MIL/uL (ref 4.22–5.81)
RDW: 14.6 % (ref 11.5–15.5)
WBC: 12.9 10*3/uL — ABNORMAL HIGH (ref 4.0–10.5)
nRBC: 0 % (ref 0.0–0.2)

## 2022-08-10 LAB — BASIC METABOLIC PANEL
Anion gap: 11 (ref 5–15)
BUN: <5 mg/dL — ABNORMAL LOW (ref 6–20)
CO2: 23 mmol/L (ref 22–32)
Calcium: 9 mg/dL (ref 8.9–10.3)
Chloride: 104 mmol/L (ref 98–111)
Creatinine, Ser: 0.69 mg/dL (ref 0.61–1.24)
GFR, Estimated: >60 mL/min (ref >60)
Glucose, Bld: 136 mg/dL — ABNORMAL HIGH (ref 70–99)
Potassium: 3.8 mmol/L (ref 3.5–5.1)
Sodium: 138 mmol/L (ref 135–145)

## 2022-08-10 LAB — D-DIMER, QUANTITATIVE: D-Dimer, Quant: 0.29 ug/mL-FEU (ref 0.00–0.50)

## 2022-08-10 LAB — TROPONIN I (HIGH SENSITIVITY)
Troponin I (High Sensitivity): 4 ng/L (ref <18)
Troponin I (High Sensitivity): 5 ng/L (ref <18)

## 2022-08-10 LAB — SARS CORONAVIRUS 2 BY RT PCR: SARS Coronavirus 2 by RT PCR: NEGATIVE

## 2022-08-10 MED ORDER — PREDNISONE 10 MG PO TABS: 20.0000 mg | ORAL_TABLET | Freq: Every day | ORAL | 0 refills | Status: DC

## 2022-08-10 MED ORDER — METHYLPREDNISOLONE SODIUM SUCC 125 MG IJ SOLR
125.0000 mg | Freq: Once | INTRAMUSCULAR | Status: AC
  Administered 2022-08-10: 125 mg via INTRAVENOUS
  Filled 2022-08-10: qty 2

## 2022-08-10 MED ORDER — BUDESONIDE-FORMOTEROL FUMARATE 80-4.5 MCG/ACT IN AERO: 2.0000 | INHALATION_SPRAY | Freq: Three times a day (TID) | RESPIRATORY_TRACT | 12 refills | Status: DC | PRN

## 2022-08-10 MED ORDER — LACTATED RINGERS IV BOLUS
1000.0000 mL | Freq: Once | INTRAVENOUS | Status: AC
  Administered 2022-08-10: 1000 mL via INTRAVENOUS

## 2022-08-10 MED ORDER — IPRATROPIUM-ALBUTEROL 0.5-2.5 (3) MG/3ML IN SOLN
3.0000 mL | Freq: Once | RESPIRATORY_TRACT | Status: AC
  Administered 2022-08-10: 3 mL via RESPIRATORY_TRACT
  Filled 2022-08-10: qty 3

## 2022-08-10 NOTE — ED Triage Notes (Signed)
Pt reports SHOB, hx asthma. Reports no relief w/ albuterol. Pt has labored breathing in triage.

## 2022-08-10 NOTE — ED Notes (Signed)
Stuck pt 2x and blue top was not ordered when I stuck the pt

## 2022-08-10 NOTE — ED Provider Triage Note (Signed)
Emergency Medicine Provider Triage Evaluation Note  Cody Weiss , a 51 y.o. male  was evaluated in triage.  Pt complains of sob. Report cough, sob, headache, congestion and wheezing x 2 weeks not relief with his inhaler and nebulizer.  No hx of PE/DVT, no pain  Review of Systems  Positive: As above Negative: As above  Physical Exam  BP (!) 154/97 (BP Location: Left Arm)   Pulse (!) 104   Temp 98.3 F (36.8 C) (Oral)   Resp 14   SpO2 98%  Gen:   Awake, no distress   Resp:  Normal effort  MSK:   Moves extremities without difficulty  Other:    Medical Decision Making  Medically screening exam initiated at 2:23 PM.  Appropriate orders placed.  Cody Weiss was informed that the remainder of the evaluation will be completed by another provider, this initial triage assessment does not replace that evaluation, and the importance of remaining in the ED until their evaluation is complete.     Fayrene Helper, PA-C 08/10/22 1424

## 2022-08-10 NOTE — ED Provider Notes (Signed)
[facility]-EMERGENCY DEPT Provider Note  CSN: [id number] Arrival date & time: 08/10/22 1341  Chief Complaint(s) Shortness of Breath  HPI Cody Weiss is a 51 y.o. male with history of asthma presenting to the emergency department shortness of breath.  Patient reports that he used his nebulizer and inhaler at home without significant improvement.  He reports cough, runny nose, sore throat recently.  Reports that this has been present for the last week, denies fevers or chills.  Denies any abdominal pain, nausea, vomiting, diarrhea.  Denies chest pain.  Symptoms are moderate.  Past Medical History Past Medical History:  Diagnosis Date  . Bronchitis   . Hypertension   . Pneumonia 10/2016   Patient Active Problem List   Diagnosis Date Noted  . Ankle syndesmosis disruption, left, initial encounter 03/21/2020  . Displaced segmental fracture of shaft of left fibula, initial encounter for closed fracture 03/21/2020  . Hypertension   . Closed displaced segmental fracture of shaft of left tibia 03/17/2020  . CAP (community acquired pneumonia) 11/14/2016  . Acute bronchitis 11/14/2016   Home Medication(s) Prior to Admission medications   Medication Sig Start Date End Date Taking? Authorizing Provider  budesonide-formoterol (SYMBICORT) 80-4.5 MCG/ACT inhaler Inhale 2 puffs into the lungs 3 (three) times daily as needed. 08/10/22  Yes Lonell Grandchild, MD  predniSONE (DELTASONE) 10 MG tablet Take 2 tablets (20 mg total) by mouth daily. 08/10/22  Yes Lonell Grandchild, MD  cetirizine (ZYRTEC) 10 MG tablet Take 10 mg by mouth daily. 12/28/19   [provider]  Cholecalciferol (VITAMIN D) 125 MCG (5000 UT) CAPS Take 5,000 Units by mouth daily. 03/21/20   West Bali, PA-C  hydrochlorothiazide (MICROZIDE) 12.5 MG capsule Take 12.5 mg by mouth daily. 03/15/20   [provider]  HYDROcodone-acetaminophen (NORCO) 7.5-325 MG tablet Take 1-2 tablets by mouth  every 4 (four) hours as needed for severe pain. 03/18/20   [place] Bali, PA-C  methocarbamol (ROBAXIN) 500 MG tablet Take 1 tablet (500 mg total) by mouth every 6 (six) hours as needed for muscle spasms. 03/18/20   [place] Bali, PA-C  metoprolol succinate (TOPROL-XL) 25 MG 24 hr tablet Take 25 mg by mouth 2 (two) times daily. 02/23/20   [provider]  PROAIR HFA 108 (90 Base) MCG/ACT inhaler Inhale 1 puff into the lungs every 6 (six) hours as needed for shortness of breath. 01/18/20   [provider]                                                                                                                                    Past Surgical History Past Surgical History:  Procedure Laterality Date  . HAND SURGERY    . HERNIA REPAIR    . ORIF ANKLE FRACTURE Left 03/18/2020   Procedure: OPEN REDUCTION INTERNAL FIXATION (ORIF) ANKLE FRACTURE;  Surgeon: Roby Lofts, MD;  Location[facility]  OR;  Service: Orthopedics;  Laterality: Left;  . TIBIA IM NAIL INSERTION Left 03/18/2020   Procedure: INTRAMEDULLARY (IM) NAIL TIBIAL;  Surgeon: Roby Lofts, MD;  Location: [facility] OR;  Service: Orthopedics;  Laterality: Left;   Family History History reviewed. No pertinent family history.  Social History Social History   Tobacco Use  . Smoking status: Never  . Smokeless tobacco: Current    Types: Snuff  Substance Use Topics  . Alcohol use: Yes    Comment: occ  . Drug use: No   Allergies Patient has no known allergies.  Review of Systems Review of Systems  All other systems reviewed and are negative.   Physical Exam Vital Signs  I have reviewed the triage vital signs BP (!) 145/96 (BP Location: Right Arm)   Pulse 99   Temp 99.1 F (37.3 C) (Oral)   Resp 17   SpO2 96%  Physical Exam Vitals and nursing note reviewed.  Constitutional:      General: He is not in acute distress.    Appearance: Normal appearance.  HENT:     Mouth/Throat:     Mouth: Mucous membranes are  moist.  Cardiovascular:     Rate and Rhythm: Normal rate and regular rhythm.  Pulmonary:     Effort: Pulmonary effort is normal. No respiratory distress.     Breath sounds: Wheezing (diffuse mild wheezing) present.  Abdominal:     General: Abdomen is flat.     Palpations: Abdomen is soft.     Tenderness: There is no abdominal tenderness.  Skin:    General: Skin is warm and dry.     Capillary Refill: Capillary refill takes less than 2 seconds.  Neurological:     Mental Status: He is alert and oriented to person, place, and time. Mental status is at baseline.  Psychiatric:        Mood and Affect: Mood normal.        Behavior: Behavior normal.     ED Results and Treatments Labs (all labs ordered are listed, but only abnormal results are displayed) Labs Reviewed  BASIC METABOLIC PANEL - Abnormal; Notable for the following components:      Result Value   Glucose, Bld 136 (*)    BUN <5 (*)    All other components within normal limits  CBC - Abnormal; Notable for the following components:   WBC 12.9 (*)    All other components within normal limits  SARS CORONAVIRUS 2 BY RT PCR  D-DIMER, QUANTITATIVE  TROPONIN I (HIGH SENSITIVITY)  TROPONIN I (HIGH SENSITIVITY)                                                                                                                          Radiology DG Chest 2 View  Result Date: 08/10/2022 CLINICAL DATA:  Shortness of breath EXAM: CHEST - 2 VIEW COMPARISON:  11/18/2018 FINDINGS: The heart size and mediastinal contours are within normal limits. Both lungs are  clear. The visualized skeletal structures are unremarkable. IMPRESSION: No active cardiopulmonary disease. Electronically Signed   By: Ernie Avena M.D.   On: 08/10/2022 14:14    Pertinent labs & imaging results that were available during my care of the patient were reviewed by me and considered in my medical decision making (see MDM for details).  Medications Ordered in  ED Medications  ipratropium-albuterol (DUONEB) 0.5-2.5 (3) MG/3ML nebulizer solution 3 mL (3 mLs Nebulization Given 08/10/22 1819)  methylPREDNISolone sodium succinate (SOLU-MEDROL) 125 mg/2 mL injection 125 mg (125 mg Intravenous Given 08/10/22 1630)  lactated ringers bolus 1,000 mL (0 mLs Intravenous Stopped 08/10/22 1946)                                                                                                                                     Procedures Procedures  (including critical care time)  Medical Decision Making / ED Course   MDM:  51 year old male presenting to the emergency department with shortness of breath.  Suspect asthma given mild diffuse wheezing.  Chest x-ray clear, no sign of pneumothorax, pneumonia.  Asthma likely in the setting of upper respiratory tract infection.  D-dimer negative, doubt PE.  COVID test negative.  Will give steroids, breathing treatments, reassess.  Clinical Course as of 08/10/22 2322  Fri Aug 10, 2022  6945 Patient feels much better after breathing treatment and steroids.  Labs reassuring including delta troponin negative.  D-dimer negative.  I independently reviewed x-ray.  Will send prescription for steroids as well as for symbicort inhaler. Will discharge patient to home. All questions answered. Patient comfortable with plan of discharge. Return precautions discussed with patient and specified on the after visit summary.  [WS]    Clinical Course User Index [WS] Lonell Grandchild, MD     Additional history obtained: -Additional history obtained from wife -External records from outside source obtained and reviewed including: Chart review including previous notes, labs, imaging, consultation notes   Lab Tests: -I ordered, reviewed, and interpreted labs.   The pertinent results include:   Labs Reviewed  BASIC METABOLIC PANEL - Abnormal; Notable for the following components:      Result Value   Glucose, Bld 136 (*)    BUN <5  (*)    All other components within normal limits  CBC - Abnormal; Notable for the following components:   WBC 12.9 (*)    All other components within normal limits  SARS CORONAVIRUS 2 BY RT PCR  D-DIMER, QUANTITATIVE  TROPONIN I (HIGH SENSITIVITY)  TROPONIN I (HIGH SENSITIVITY)      EKG   EKG Interpretation  Date/Time:  Friday August 10 2022 13:48:39 EDT Ventricular Rate:  103 PR Interval:  136 QRS Duration: 93 QT Interval:  342 QTC Calculation: 448 R Axis:   63 Text Interpretation: Sinus tachycardia Baseline wander in lead(s) V3 TWI in lead V4 Confirmed by Alvino Blood ([id number]) on 08/10/2022 4:33:21  PM         Imaging Studies ordered: I ordered imaging studies including CXR I independently visualized and interpreted imaging. I agree with the radiologist interpretation   Medicines ordered and prescription drug management: Meds ordered this encounter  Medications  . ipratropium-albuterol (DUONEB) 0.5-2.5 (3) MG/3ML nebulizer solution 3 mL  . methylPREDNISolone sodium succinate (SOLU-MEDROL) 125 mg/2 mL injection 125 mg    IV methylprednisolone will be converted to either a q12h or q24h frequency with the same total daily dose (TDD).  Ordered Dose: 1 to 125 mg TDD; convert to: TDD q24h.  Ordered Dose: 126 to 250 mg TDD; convert to: TDD div q12h.  Ordered Dose: >250 mg TDD; DAW.  . lactated ringers bolus 1,000 mL  . predniSONE (DELTASONE) 10 MG tablet    Sig: Take 2 tablets (20 mg total) by mouth daily.    Dispense:  15 tablet    Refill:  0  . budesonide-formoterol (SYMBICORT) 80-4.5 MCG/ACT inhaler    Sig: Inhale 2 puffs into the lungs 3 (three) times daily as needed.    Dispense:  1 each    Refill:  12    -I have reviewed the patients home medicines and have made adjustments as needed    Cardiac Monitoring: The patient was maintained on a cardiac monitor.  I personally viewed and interpreted the cardiac monitored which showed an underlying rhythm of:  NSR  Social Determinants of Health:  Factors impacting patients care include: alcohol user   Reevaluation: After the interventions noted above, I reevaluated the patient and found that they have :improved  Co morbidities that complicate the patient evaluation . Past Medical History:  Diagnosis Date  . Bronchitis   . Hypertension   . Pneumonia 10/2016      Dispostion: I considered admission for this patient, but resolution of symptoms, will discharge to home.     Final Clinical Impression(s) / ED Diagnoses Final diagnoses:  Mild intermittent asthma with exacerbation     This chart was dictated using voice recognition software.  Despite best efforts to proofread,  errors can occur which can change the documentation meaning.    Lonell Grandchild, MD 08/10/22 2322

## 2023-02-08 DIAGNOSIS — Z20822 Contact with and (suspected) exposure to covid-19: Secondary | ICD-10-CM | POA: Diagnosis not present

## 2023-02-08 DIAGNOSIS — I10 Essential (primary) hypertension: Secondary | ICD-10-CM | POA: Diagnosis not present

## 2023-02-08 DIAGNOSIS — R059 Cough, unspecified: Secondary | ICD-10-CM | POA: Diagnosis not present

## 2023-02-08 DIAGNOSIS — J209 Acute bronchitis, unspecified: Secondary | ICD-10-CM | POA: Diagnosis not present

## 2023-03-04 ENCOUNTER — Ambulatory Visit (INDEPENDENT_AMBULATORY_CARE_PROVIDER_SITE_OTHER): Payer: Medicaid Other | Admitting: Family Medicine

## 2023-03-04 ENCOUNTER — Encounter: Payer: Self-pay | Admitting: Family Medicine

## 2023-03-04 VITALS — BP 150/95 | HR 85 | Ht 64.0 in | Wt 209.0 lb

## 2023-03-04 DIAGNOSIS — I10 Essential (primary) hypertension: Secondary | ICD-10-CM | POA: Diagnosis not present

## 2023-03-04 DIAGNOSIS — R7989 Other specified abnormal findings of blood chemistry: Secondary | ICD-10-CM | POA: Diagnosis not present

## 2023-03-04 DIAGNOSIS — R7309 Other abnormal glucose: Secondary | ICD-10-CM | POA: Insufficient documentation

## 2023-03-04 DIAGNOSIS — E119 Type 2 diabetes mellitus without complications: Secondary | ICD-10-CM | POA: Insufficient documentation

## 2023-03-04 DIAGNOSIS — E559 Vitamin D deficiency, unspecified: Secondary | ICD-10-CM | POA: Insufficient documentation

## 2023-03-04 MED ORDER — METHOCARBAMOL 500 MG PO TABS: 500.0000 mg | ORAL_TABLET | Freq: Four times a day (QID) | ORAL | 0 refills | Status: DC | PRN

## 2023-03-04 MED ORDER — METOPROLOL SUCCINATE ER 25 MG PO TB24: 25.0000 mg | ORAL_TABLET | Freq: Every day | ORAL | 3 refills | Status: DC

## 2023-03-04 MED ORDER — HYDROCHLOROTHIAZIDE 12.5 MG PO CAPS: 12.5000 mg | ORAL_CAPSULE | Freq: Every day | ORAL | 2 refills | Status: DC
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
## 2023-03-04 NOTE — Assessment & Plan Note (Signed)
Obtain cmp.  Recheck at followup.   Adding hctz to metoprolol. Consider weaning off metoprolol.

## 2023-03-04 NOTE — Patient Instructions (Signed)
It was nice to meet you,   I have ordered some lab tests for you.  We will let you know the results when we get them.   I have reordered your robaxin.   I have reordered your metoprolol.  Please take it once a day.    I have restarted your hydrochlorothiazide.  Please take it once a day.    Please follow up in one month to recheck your blood pressure and discuss your lab results.    Thanks,   Dr. Jeannine Kitten

## 2023-03-04 NOTE — Assessment & Plan Note (Signed)
Hepatic steatosis seen on previous ct.  Recheck cmp. Discuss lifestyle changes at next visit.  Pt overweight and consumes alcohol.  Consider further imaging based on cmp results.

## 2023-03-05 LAB — VITAMIN D 25 HYDROXY (VIT D DEFICIENCY, FRACTURES): Vit D, 25-Hydroxy: 11.5 ng/mL — ABNORMAL LOW (ref 30.0–100.0)

## 2023-03-05 LAB — LIPID PANEL
Chol/HDL Ratio: 3 ratio (ref 0.0–5.0)
Cholesterol, Total: 179 mg/dL (ref 100–199)
HDL: 60 mg/dL (ref >39)
LDL Chol Calc (NIH): 105 mg/dL — ABNORMAL HIGH (ref 0–99)
Triglycerides: 75 mg/dL (ref 0–149)
VLDL Cholesterol Cal: 14 mg/dL (ref 5–40)

## 2023-03-05 LAB — COMPREHENSIVE METABOLIC PANEL
ALT: 25 IU/L (ref 0–44)
AST: 24 IU/L (ref 0–40)
Albumin/Globulin Ratio: 1.6 (ref 1.2–2.2)
Albumin: 4.2 g/dL (ref 4.1–5.1)
Alkaline Phosphatase: 97 IU/L (ref 44–121)
BUN/Creatinine Ratio: 4 — ABNORMAL LOW (ref 9–20)
BUN: 3 mg/dL — ABNORMAL LOW (ref 6–24)
Bilirubin Total: 0.8 mg/dL (ref 0.0–1.2)
CO2: 23 mmol/L (ref 20–29)
Calcium: 9.1 mg/dL (ref 8.7–10.2)
Chloride: 103 mmol/L (ref 96–106)
Creatinine, Ser: 0.68 mg/dL — ABNORMAL LOW (ref 0.76–1.27)
Globulin, Total: 2.7 g/dL (ref 1.5–4.5)
Glucose: 114 mg/dL — ABNORMAL HIGH (ref 70–99)
Potassium: 4.3 mmol/L (ref 3.5–5.2)
Sodium: 144 mmol/L (ref 134–144)
Total Protein: 6.9 g/dL (ref 6.0–8.5)
eGFR: 114 mL/min/{1.73_m2} (ref >59)

## 2023-03-05 LAB — HEMOGLOBIN A1C
Est. average glucose Bld gHb Est-mCnc: 140 mg/dL
Hgb A1c MFr Bld: 6.5 % — ABNORMAL HIGH (ref 4.8–5.6)

## 2023-03-06 ENCOUNTER — Other Ambulatory Visit: Payer: Self-pay | Admitting: Family Medicine

## 2023-03-06 MED ORDER — VITAMIN D3 50 MCG (2000 UT) PO CAPS: 1.0000 | ORAL_CAPSULE | Freq: Every day | ORAL | 0 refills | Status: AC

## 2023-03-06 MED ORDER — METFORMIN HCL ER (OSM) 500 MG PO TB24
500.0000 mg | ORAL_TABLET | Freq: Every day | ORAL | 1 refills | Status: DC
Start: 2023-03-06 — End: 2023-09-10

## 2023-03-06 MED ORDER — ATORVASTATIN CALCIUM 20 MG PO TABS: 20.0000 mg | ORAL_TABLET | Freq: Every day | ORAL | 3 refills | Status: DC

## 2023-03-06 NOTE — Progress Notes (Signed)
Discussed lab tests with patient.  Sent in Rx for vit d, metformin, and atorvastatin

## 2023-03-13 ENCOUNTER — Telehealth: Payer: Self-pay | Admitting: *Deleted

## 2023-03-13 NOTE — Telephone Encounter (Signed)
[facility] calling to get verbal to change patients Rx from the Metformin OSM to the Metformin ER.  Verbal given and informed provider. Latha Staunton Zimmerman Rumple, CMA

## 2023-04-01 ENCOUNTER — Ambulatory Visit (INDEPENDENT_AMBULATORY_CARE_PROVIDER_SITE_OTHER): Payer: Medicaid Other | Admitting: Family Medicine

## 2023-04-01 ENCOUNTER — Encounter: Payer: Self-pay | Admitting: Family Medicine

## 2023-04-01 VITALS — BP 138/84 | HR 79 | Ht 64.0 in | Wt 215.0 lb

## 2023-04-01 DIAGNOSIS — E1169 Type 2 diabetes mellitus with other specified complication: Secondary | ICD-10-CM | POA: Diagnosis not present

## 2023-04-01 DIAGNOSIS — E1165 Type 2 diabetes mellitus with hyperglycemia: Secondary | ICD-10-CM | POA: Diagnosis not present

## 2023-04-01 DIAGNOSIS — E785 Hyperlipidemia, unspecified: Secondary | ICD-10-CM | POA: Diagnosis not present

## 2023-04-01 DIAGNOSIS — I10 Essential (primary) hypertension: Secondary | ICD-10-CM

## 2023-04-01 DIAGNOSIS — F419 Anxiety disorder, unspecified: Secondary | ICD-10-CM

## 2023-04-01 MED ORDER — CITALOPRAM HYDROBROMIDE 10 MG PO TABS: 10.0000 mg | ORAL_TABLET | Freq: Every day | ORAL | 3 refills | Status: DC

## 2023-04-01 NOTE — Assessment & Plan Note (Signed)
Improved today.  Patient does not want to change any of his medications.  Will continue hydrochlorothiazide and metoprolol.

## 2023-04-01 NOTE — Progress Notes (Signed)
   Established Patient Office Visit  Subjective   Patient ID: Cody Weiss, male    DOB: May 08, 1971  Age: 52 y.o. MRN: [id number]  Chief Complaint  Patient presents with   Hypertension    DM 2-patient did not pick up his metformin.  Did not want to take it.  Wants to try managing his diabetes through diet and weight loss.  Discussed different ways to reduce carbohydrates such as decreasing liquid calories and grains/starches.  Hyperlipidemia-patient started his statin medication.  No issues with taking this medication.  Hypertension-patient taking hydrochlorothiazide and is metoprolol.  Does not want to make any changes to these medications right now.  Vitamin D deficiency-has not picked up his vitamin D supplement yet.  Will do that later today.  Anxiety/stress-patient stressed out by his home environment.  He lives with several of his wife's children and it is a stressful environment for him.  Would like to try something to help with anxiety.  Discussed different options he would prefer to take something once a day.  Has more issues during the day than he does with difficulty sleeping.  Discussed citalopram and SSRIs.        ROS    Objective:     BP 138/84   Pulse 79   Ht 5\' 4"  (1.626 m)   Wt 215 lb (97.5 kg)   SpO2 98% Comment: on RA  BMI 36.90 kg/m    Physical Exam Constitutional:      Appearance: Normal appearance.  HENT:     Head: Normocephalic.  Pulmonary:     Effort: Pulmonary effort is normal.  Neurological:     Mental Status: He is alert.  Psychiatric:        Mood and Affect: Mood normal.        Behavior: Behavior normal.      No results found for any visits on 04/01/23.    The 10-year ASCVD risk score (Arnett DK, et al., 2019) is: 5.6%    Assessment & Plan:   Problem List Items Addressed This Visit       Cardiovascular and Mediastinum   Hypertension - Primary    Improved today.  Patient does not want to change any of his medications.   Will continue hydrochlorothiazide and metoprolol.        Endocrine   DM2 (diabetes mellitus, type 2)    Prescription for metformin was sent in, patient did not pick this up.  Would prefer to use diet and weight loss.  Discussed l ways to eliminate carbohydrates from his diet with him. - Follow-up A1c in 3 months.      Hyperlipidemia associated with type 2 diabetes mellitus     Other   Anxiety    Patient feels anxious/stressed out in regards to his home life.  When we started to discuss it he became somewhat choked up.  He is willing to start a medication - Starting citalopram - Follow-up in 4 weeks.      Relevant Medications   citalopram (CELEXA) 10 MG tablet    Return in about 4 weeks (around 04/29/2023) for anxiety.    Sandre Kitty, MD

## 2023-04-01 NOTE — Assessment & Plan Note (Signed)
Patient feels anxious/stressed out in regards to his home life.  When we started to discuss it he became somewhat choked up.  He is willing to start a medication - Starting citalopram - Follow-up in 4 weeks.

## 2023-04-01 NOTE — Patient Instructions (Signed)
I have prescribed a medication called citalopram for you to take.  Take it once a day before bed. You will need to take it for at least 4 weeks before we decide if it is working or not.    Have a great day,   Dr. Constance Goltz

## 2023-04-01 NOTE — Assessment & Plan Note (Addendum)
Prescription for metformin was sent in, patient did not pick this up.  Would prefer to use diet and weight loss.  Discussed l ways to eliminate carbohydrates from his diet with him. - Follow-up A1c in 3 months.

## 2023-04-15 ENCOUNTER — Telehealth: Payer: Self-pay

## 2023-04-15 NOTE — Telephone Encounter (Signed)
Pt came by the office with some Disability paperwork.  Pt was made aware of the 29$ fee that will be paid upon pick up.  Please call pt when ready

## 2023-04-16 NOTE — Telephone Encounter (Signed)
Contacted pt to inform him that he would need to have an appointment to discuss this before any paper work could be completed since we have not seen him for this previously. He has an upcoming appointment already, I put this in the appointment notes.

## 2023-05-06 ENCOUNTER — Ambulatory Visit (INDEPENDENT_AMBULATORY_CARE_PROVIDER_SITE_OTHER): Payer: Medicaid Other | Admitting: Family Medicine

## 2023-05-06 ENCOUNTER — Encounter: Payer: Self-pay | Admitting: Family Medicine

## 2023-05-06 VITALS — BP 136/84 | HR 86 | Temp 98.7°F | Ht 64.0 in | Wt 211.0 lb

## 2023-05-06 DIAGNOSIS — F419 Anxiety disorder, unspecified: Secondary | ICD-10-CM | POA: Diagnosis not present

## 2023-05-06 DIAGNOSIS — S82262S Displaced segmental fracture of shaft of left tibia, sequela: Secondary | ICD-10-CM | POA: Diagnosis not present

## 2023-05-06 DIAGNOSIS — J45909 Unspecified asthma, uncomplicated: Secondary | ICD-10-CM

## 2023-05-06 DIAGNOSIS — S82262D Displaced segmental fracture of shaft of left tibia, subsequent encounter for closed fracture with routine healing: Secondary | ICD-10-CM | POA: Diagnosis not present

## 2023-05-06 MED ORDER — ALBUTEROL SULFATE HFA 108 (90 BASE) MCG/ACT IN AERS: 2.0000 | INHALATION_SPRAY | RESPIRATORY_TRACT | 5 refills | Status: DC | PRN

## 2023-05-06 MED ORDER — ALBUTEROL SULFATE (2.5 MG/3ML) 0.083% IN NEBU: 2.5000 mg | INHALATION_SOLUTION | Freq: Four times a day (QID) | RESPIRATORY_TRACT | 1 refills | Status: AC | PRN

## 2023-05-06 NOTE — Patient Instructions (Signed)
It was nice to see you today,  I will fill out your paperwork for disability And send it in.  I have sent in your albuterol inhaler and nebulizer.  Please also use your Claritin.  Your citalopram is also available for you.  I would like to see back in 1 month.  We can discuss your diabetes and other issues at that time.  Have a great day,  Frederic Jericho, MD

## 2023-05-06 NOTE — Progress Notes (Signed)
   Established Patient Office Visit  Subjective   Patient ID: Cody Weiss, male    DOB: 01-17-1971  Age: 52 y.o. MRN: [id number]  Chief Complaint  Patient presents with   Anxiety   Discuss Leg Problem    Discuss disability   Asthma     Left leg -patient had an accident in 2021 at work in which his left tibia was fractured.  Since then he has had difficulty ambulating.  Has difficulty standing or sitting for long periods of time.  Unable to lift more than 15 to 20 pounds he estimates.  Does not currently take anything for his pain as it does not help him to take over-the-counter medications.  Has been taking Robaxin that was prescribed for him a few months ago.  Has difficulty going up stairs.  Has occasional pains that shoot up his left ankle to his back since the surgery.  Usually last about 5 minutes.  Patient complains of worsening wheezing due to his allergies.  Takes oral antihistamines but needs to get refills.  Does not use cigarettes or cigars.  Does use dip.  Has used albuterol in the past for his wheezing and this helps.  Also has a nebulizer which she thinks also contains albuterol.  Patient has not been taking escitalopram since we prescribed it on his last visit.  Did not pick it up from the pharmacy.  Discussed using it to help with the stress at home which he says has not changed or improved since last time.       ROS    Objective:     BP 136/84   Pulse 86   Temp 98.7 F (37.1 C) (Oral)   Ht 5\' 4"  (1.626 m)   Wt 211 lb 0.1 oz (95.7 kg)   SpO2 96%   BMI 36.22 kg/m    Physical Exam General: Alert, oriented Pulmonary: Mild left-sided wheezing, high-pitched stertorous noises MSK: No tenderness of the left knee.  Is tender of the left shin midline.  Reduced range of motion of the left knee.  Reduced strength in the left leg.  No results found for any visits on 05/06/23.    The 10-year ASCVD risk score (Arnett DK, et al., 2019) is: 5.4%    Assessment &  Plan:   Problem List Items Addressed This Visit       Respiratory   Asthma due to environmental allergies    Refilled albuterol.  Patient with mild wheeze on exam today.      Relevant Medications   albuterol (VENTOLIN HFA) 108 (90 Base) MCG/ACT inhaler   albuterol (PROVENTIL) (2.5 MG/3ML) 0.083% nebulizer solution     Musculoskeletal and Integument   Closed displaced segmental fracture of shaft of left tibia - Primary    Mobility and strength limited.  Pain is sporadic.  Filled out out patient's disability paperwork        Other   Anxiety    Has not picked up the medication prescribed at last visit.  Symptoms and situation has not changed.  Advised patient that he can still pick it up at the pharmacy.  Will discuss again at next visit.       Return in about 4 weeks (around 06/03/2023) for DM.   30 minutes was spent in the management of this patient.  Sandre Kitty, MD

## 2023-05-07 DIAGNOSIS — J45909 Unspecified asthma, uncomplicated: Secondary | ICD-10-CM | POA: Insufficient documentation

## 2023-05-07 NOTE — Assessment & Plan Note (Signed)
Has not picked up the medication prescribed at last visit.  Symptoms and situation has not changed.  Advised patient that he can still pick it up at the pharmacy.  Will discuss again at next visit.

## 2023-05-07 NOTE — Assessment & Plan Note (Signed)
Refilled albuterol.  Patient with mild wheeze on exam today.

## 2023-05-07 NOTE — Assessment & Plan Note (Signed)
Mobility and strength limited.  Pain is sporadic.  Filled out out patient's disability paperwork

## 2023-05-08 NOTE — Telephone Encounter (Signed)
Called pt to let him know paperwork was complete and that the 29$ fee was still needing to be collected.   Paperwork has been faxed and confirmation received.

## 2023-05-18 ENCOUNTER — Other Ambulatory Visit: Payer: Self-pay | Admitting: Family Medicine

## 2023-06-04 ENCOUNTER — Ambulatory Visit: Payer: Medicaid Other | Admitting: Family Medicine

## 2023-06-04 NOTE — Progress Notes (Deleted)
   Established Patient Office Visit  Subjective   Patient ID: ADOLPHO URSIN, male    DOB: 10-26-1971  Age: 52 y.o. MRN: [id number]  No chief complaint on file.   HPI  Anxiety medication?   Dm - taking metformin and lipitor?   {History (Optional):23778}  ROS    Objective:     There were no vitals taken for this visit. {Vitals History (Optional):23777}  Physical Exam   No results found for any visits on 06/04/23.  {Labs (Optional):23779}  The 10-year ASCVD risk score (Arnett DK, et al., 2019) is: 5.4%    Assessment & Plan:   Problem List Items Addressed This Visit   None   No follow-ups on file.    Sandre Kitty, MD

## 2023-06-06 ENCOUNTER — Encounter: Payer: Self-pay | Admitting: Family Medicine

## 2023-06-06 ENCOUNTER — Ambulatory Visit (INDEPENDENT_AMBULATORY_CARE_PROVIDER_SITE_OTHER): Payer: Medicaid Other | Admitting: Family Medicine

## 2023-06-06 VITALS — BP 139/95 | HR 80 | Temp 98.4°F | Ht 65.0 in | Wt 213.0 lb

## 2023-06-06 DIAGNOSIS — E785 Hyperlipidemia, unspecified: Secondary | ICD-10-CM

## 2023-06-06 DIAGNOSIS — E1169 Type 2 diabetes mellitus with other specified complication: Secondary | ICD-10-CM

## 2023-06-06 DIAGNOSIS — I10 Essential (primary) hypertension: Secondary | ICD-10-CM | POA: Diagnosis not present

## 2023-06-06 DIAGNOSIS — E1165 Type 2 diabetes mellitus with hyperglycemia: Secondary | ICD-10-CM

## 2023-06-06 LAB — POCT GLYCOSYLATED HEMOGLOBIN (HGB A1C): Hemoglobin A1C: 6.2 % — AB (ref 4.0–5.6)

## 2023-06-06 NOTE — Progress Notes (Signed)
   Established Patient Office Visit  Subjective   Patient ID: Cody Weiss, male    DOB: Jan 29, 1971  Age: 52 y.o. MRN: [id number]  Chief Complaint  Patient presents with   Follow-up    HPI  DM2: Patient states he has not taken his diabetes medication since our initial visit.  States he has been trying to limit his carbohydrate intake.  Hypertension: Patient has been taking metoprolol but has not picked up his hydrochlorothiazide prescription.  HLD: Patient has been taking his statin.  No issues with tolerating the medication.    The 10-year ASCVD risk score (Arnett DK, et al., 2019) is: 5.6%     ROS    Objective:     BP (!) 139/95   Pulse 80   Temp 98.4 F (36.9 C) (Oral)   Ht 5\' 5"  (1.651 m)   Wt 213 lb (96.6 kg)   SpO2 95%   BMI 35.45 kg/m    Physical Exam   Results for orders placed or performed in visit on 06/06/23  POCT HgB A1C  Result Value Ref Range   Hemoglobin A1C 6.2 (A) 4.0 - 5.6 %   HbA1c POC (<> result, manual entry)     HbA1c, POC (prediabetic range)     HbA1c, POC (controlled diabetic range)            Assessment & Plan:   Type 2 diabetes mellitus with hyperglycemia, without long-term current use of insulin (HCC) Assessment & Plan: Patient has not been taking the metformin.  Has been monitoring his diet.  A1c improved from 6.5-6.2.  Discussed starting metformin versus continuing to use dietary changes. - Follow-up lipid panel - Follow-up UACR  Orders: -     Microalbumin / creatinine urine ratio; Future -     POCT glycosylated hemoglobin (Hb A1C) -     Lipid panel; Future  Hyperlipidemia associated with type 2 diabetes mellitus (HCC) Assessment & Plan: Continue statin - Follow-up lipid panel   Primary hypertension Assessment & Plan: Slightly elevated today.  Patient not taking HCTZ which was prescribed.  Advised patient to pick this medication up and begin taking it.      Return in about 3 months (around 09/06/2023)  for hld.    Sandre Kitty, MD

## 2023-06-06 NOTE — Patient Instructions (Signed)
It was nice to see you today,  We addressed the following topics today: - your A1c was better.  It was 6.2%.  continue limiting your sugar intake.   - please make an appointment for labs sometime next week.   - follow up with me in 3 months.    Have a great day,  Frederic Jericho, MD

## 2023-06-06 NOTE — Assessment & Plan Note (Signed)
Patient has not been taking the metformin.  Has been monitoring his diet.  A1c improved from 6.5-6.2.  Discussed starting metformin versus continuing to use dietary changes. - Follow-up lipid panel - Follow-up UACR

## 2023-06-06 NOTE — Assessment & Plan Note (Signed)
-   Continue statin - Follow-up lipid panel 

## 2023-06-06 NOTE — Assessment & Plan Note (Signed)
Slightly elevated today.  Patient not taking HCTZ which was prescribed.  Advised patient to pick this medication up and begin taking it.

## 2023-06-10 ENCOUNTER — Other Ambulatory Visit: Payer: Medicaid Other

## 2023-06-11 ENCOUNTER — Other Ambulatory Visit: Payer: Medicaid Other

## 2023-06-13 ENCOUNTER — Other Ambulatory Visit: Payer: Medicaid Other

## 2023-06-13 DIAGNOSIS — E1165 Type 2 diabetes mellitus with hyperglycemia: Secondary | ICD-10-CM | POA: Diagnosis not present

## 2023-06-14 LAB — LIPID PANEL
Chol/HDL Ratio: 2.9 ratio (ref 0.0–5.0)
Cholesterol, Total: 138 mg/dL (ref 100–199)
HDL: 48 mg/dL (ref >39)
LDL Chol Calc (NIH): 68 mg/dL (ref 0–99)
Triglycerides: 126 mg/dL (ref 0–149)
VLDL Cholesterol Cal: 22 mg/dL (ref 5–40)

## 2023-06-14 LAB — MICROALBUMIN / CREATININE URINE RATIO
Creatinine, Urine: 105.3 mg/dL
Microalb/Creat Ratio: 5 mg/g creat (ref 0–29)
Microalbumin, Urine: 5.3 ug/mL

## 2023-09-10 ENCOUNTER — Encounter: Payer: Self-pay | Admitting: Family Medicine

## 2023-09-10 ENCOUNTER — Ambulatory Visit (INDEPENDENT_AMBULATORY_CARE_PROVIDER_SITE_OTHER): Payer: Medicaid Other | Admitting: Family Medicine

## 2023-09-10 VITALS — BP 134/89 | HR 85 | Ht 65.0 in | Wt 206.8 lb

## 2023-09-10 DIAGNOSIS — E1169 Type 2 diabetes mellitus with other specified complication: Secondary | ICD-10-CM | POA: Diagnosis not present

## 2023-09-10 DIAGNOSIS — E785 Hyperlipidemia, unspecified: Secondary | ICD-10-CM | POA: Diagnosis not present

## 2023-09-10 DIAGNOSIS — F419 Anxiety disorder, unspecified: Secondary | ICD-10-CM

## 2023-09-10 DIAGNOSIS — I10 Essential (primary) hypertension: Secondary | ICD-10-CM | POA: Diagnosis not present

## 2023-09-10 DIAGNOSIS — E1165 Type 2 diabetes mellitus with hyperglycemia: Secondary | ICD-10-CM | POA: Diagnosis not present

## 2023-09-10 LAB — POCT GLYCOSYLATED HEMOGLOBIN (HGB A1C): HbA1c POC (<> result, manual entry): 5.8 % (ref 4.0–5.6)

## 2023-09-10 NOTE — Assessment & Plan Note (Signed)
Endorses taking hydrochlorothiazide.  States he took it this morning.  Blood pressure less than 140/90.  Continue current medication.  Continue to monitor.

## 2023-09-10 NOTE — Assessment & Plan Note (Signed)
Patient compliant with his Lipitor he states.  Continue current dose.  Recheck at next visit.

## 2023-09-10 NOTE — Progress Notes (Signed)
Established Patient Office Visit  Subjective   Patient ID: Cody Weiss, male    DOB: 1971-01-06  Age: 52 y.o. MRN: [id number]  Chief Complaint  Patient presents with   Medical Management of Chronic Issues    HPI  DM2 -patient managing his diabetes through diet control.  He states he is "avoiding sweets".  Also says he is having 1 soda a day and drinking beer at night.  Discussed reducing beer as well as soda as a way to manage his blood sugar levels through diet.  Hypertension-patient states he is taking his hydrochlorothiazide.  Does not check his blood pressure at home.  Hyperlipidemia-patient is taking his cholesterol medication Lipitor.  No questions or concerns with this medication.  Patient still has stress at home with his wife's children.  His wife's daughter now has a fourth child on the way.  All are living with the patient and patient does a significant amount of providing care for the children.   The 10-year ASCVD risk score (Arnett DK, et al., 2019) is: 5.2%  Health Maintenance Due  Topic Date Due   OPHTHALMOLOGY EXAM  Never done   COVID-19 Vaccine (1 - 2023-24 season) Never done      Objective:     BP 134/89   Pulse 85   Ht 5\' 5"  (1.651 m)   Wt 206 lb 12.8 oz (93.8 kg)   SpO2 96%   BMI 34.41 kg/m    Physical Exam General: Alert, oriented Pulm: No respiratory distress Extremities: Normal DP pulses bilaterally.  Decreased sensation in the left foot.   Results for orders placed or performed in visit on 09/10/23  POCT HgB A1C  Result Value Ref Range   Hemoglobin A1C     HbA1c POC (<> result, manual entry) 5.8 4.0 - 5.6 %   HbA1c, POC (prediabetic range)     HbA1c, POC (controlled diabetic range)          Assessment & Plan:   Type 2 diabetes mellitus with hyperglycemia, without long-term current use of insulin (HCC) Assessment & Plan: Diet controlled.  A1c improved to 5.8.  Discussed with patient limiting alcohol use in addition to sodas  and other sources of sugar.  Foot exam shows some decrease sensation on the left side, but this foot was previously injured and decreased sensation could be secondary to injury.  Orders: -     POCT glycosylated hemoglobin (Hb A1C)  Hyperlipidemia associated with type 2 diabetes mellitus (HCC) Assessment & Plan: Patient compliant with his Lipitor he states.  Continue current dose.  Recheck at next visit.   Primary hypertension Assessment & Plan: Endorses taking hydrochlorothiazide.  States he took it this morning.  Blood pressure less than 140/90.  Continue current medication.  Continue to monitor.   Anxiety Assessment & Plan: Patient's stressors at home remains present.  Still has not picked up the sertraline that we originally discussed several months ago.  Patient declines medication or therapy at this time.      Return in about 3 months (around 12/10/2023) for DM, hld.    Sandre Kitty, MD

## 2023-09-10 NOTE — Assessment & Plan Note (Signed)
Patient's stressors at home remains present.  Still has not picked up the sertraline that we originally discussed several months ago.  Patient declines medication or therapy at this time.

## 2023-09-10 NOTE — Patient Instructions (Addendum)
It was nice to see you today,  We addressed the following topics today: -Your A1c was better it was 5.8.  Continue to try to limit your sugar intake.  Beer is also a source of sugar and carbohydrates so try to limit this as well. - Continue taking your hydrochlorothiazide and regular Lipitor. - I will see back in 3 months.  Have a great day,  Frederic Jericho, MD

## 2023-09-10 NOTE — Assessment & Plan Note (Signed)
Diet controlled.  A1c improved to 5.8.  Discussed with patient limiting alcohol use in addition to sodas and other sources of sugar.  Foot exam shows some decrease sensation on the left side, but this foot was previously injured and decreased sensation could be secondary to injury.

## 2023-11-09 ENCOUNTER — Other Ambulatory Visit: Payer: Self-pay | Admitting: Family Medicine

## 2023-12-02 ENCOUNTER — Telehealth: Payer: Self-pay

## 2023-12-02 NOTE — Telephone Encounter (Signed)
Called to reschedule appt provider will not be in the office the afternoon of Tuesday or Thursday  Reschedule 12/05/23

## 2023-12-03 NOTE — Telephone Encounter (Signed)
Called again to try and rescheduled the appt. VM is full.

## 2023-12-05 ENCOUNTER — Ambulatory Visit: Payer: Medicaid Other | Admitting: Family Medicine

## 2023-12-16 ENCOUNTER — Encounter: Payer: Self-pay | Admitting: Family Medicine

## 2023-12-16 ENCOUNTER — Ambulatory Visit (INDEPENDENT_AMBULATORY_CARE_PROVIDER_SITE_OTHER): Payer: Medicaid Other | Admitting: Family Medicine

## 2023-12-16 VITALS — BP 151/98 | HR 75 | Ht 65.0 in | Wt 207.1 lb

## 2023-12-16 DIAGNOSIS — E559 Vitamin D deficiency, unspecified: Secondary | ICD-10-CM

## 2023-12-16 DIAGNOSIS — E1165 Type 2 diabetes mellitus with hyperglycemia: Secondary | ICD-10-CM

## 2023-12-16 DIAGNOSIS — E1169 Type 2 diabetes mellitus with other specified complication: Secondary | ICD-10-CM

## 2023-12-16 DIAGNOSIS — E785 Hyperlipidemia, unspecified: Secondary | ICD-10-CM

## 2023-12-16 DIAGNOSIS — I10 Essential (primary) hypertension: Secondary | ICD-10-CM | POA: Diagnosis not present

## 2023-12-16 MED ORDER — ATORVASTATIN CALCIUM 20 MG PO TABS: 20.0000 mg | ORAL_TABLET | Freq: Every day | ORAL | 3 refills | Status: DC

## 2023-12-16 MED ORDER — CITALOPRAM HYDROBROMIDE 10 MG PO TABS: 10.0000 mg | ORAL_TABLET | Freq: Every day | ORAL | 2 refills | Status: DC

## 2023-12-16 MED ORDER — VITAMIN D (ERGOCALCIFEROL) 1.25 MG (50000 UNIT) PO CAPS: 50000.0000 [IU] | ORAL_CAPSULE | ORAL | 0 refills | Status: DC

## 2023-12-16 MED ORDER — HYDROCHLOROTHIAZIDE 25 MG PO TABS: 25.0000 mg | ORAL_TABLET | Freq: Every day | ORAL | 3 refills | Status: DC

## 2023-12-16 MED ORDER — METOPROLOL SUCCINATE ER 25 MG PO TB24: 25.0000 mg | ORAL_TABLET | Freq: Every day | ORAL | 3 refills | Status: DC

## 2023-12-16 NOTE — Patient Instructions (Addendum)
It was nice to see you today,  We addressed the following topics today: -In 3 months I would like you to come back and we can check your cholesterol and A1c the week prior - I will send in your prescriptions to be picked up in January 6th. - Your medications will include an increased dose of the hydrochlorothiazide, a dose for weekly vitamin D to take for 12 weeks, and daily Celexa for anxiety.  They will also include the other medications you take already. - I would like to you to use your asthma inhaler.  If you feel like this is not helping the cough after a week of using it let me know and we can discuss alternative treatment options.  Have a great day,  Frederic Jericho, MD

## 2023-12-16 NOTE — Progress Notes (Signed)
Established Patient Office Visit  Subjective   Patient ID: Cody Weiss, male    DOB: 11-16-1971  Age: 52 y.o. MRN: [id number]  Chief Complaint  Patient presents with   Medical Management of Chronic Issues    HPI  Dm2 -patient continues to control his diabetes with diet.  Discussed getting his A1c checked in 3 months at his next visit.  Patient states he wants to have his lungs "checked".  Has been having dry throat, dry cough.  Is exposed to his grandchildren who often get respiratory illnesses.  Denies fever chills nasal congestion.  Has a history of seasonal allergies but this usually bothers him in the springtime.  Has a history of asthma.  Has not used his inhaler recently.  HLD-patient taking Lipitor.  We discussed rechecking again in 3 months.  Hypertension-patient taking hydrochlorothiazide.  Does not check his blood pressure at home.  Endorses taking daily.  Discussed increasing the dose to 25 mg and schedule.  Stress-patient still having same issues at home with wife's family.  Still has significant stress from this.  Has not picked up his SSRI but states "maybe I need to be taking that".  Vitamin D-patient not taking any vitamin D supplementation.  Discussed sending in weekly vitamin D and rechecking levels again in 3 months.   The 10-year ASCVD risk score (Arnett DK, et al., 2019) is: 5.4%  Health Maintenance Due  Topic Date Due   OPHTHALMOLOGY EXAM  Never done   COVID-19 Vaccine (1 - 2024-25 season) Never done   Zoster Vaccines- Shingrix (1 of 2) Never done      Objective:     BP (!) 151/98   Pulse 75   Ht 5\' 5"  (1.651 m)   Wt 207 lb 1.9 oz (93.9 kg)   SpO2 98%   BMI 34.47 kg/m    Physical Exam General: Alert, oriented CV: Regular rhythm Pulmonary: Mild end expiratory wheezing bilaterally.  Dry cough.  No respiratory distress or tachypnea.   No results found for any visits on 12/16/23.      Assessment & Plan:   Type 2 diabetes mellitus with  hyperglycemia, without long-term current use of insulin (HCC) Assessment & Plan: Diet controlled.  Recheck A1c in 3 months when we recheck cholesterol.  Orders: -     Hemoglobin A1c; Future  Hyperlipidemia associated with type 2 diabetes mellitus (HCC) Assessment & Plan: Recheck in 3 months.  Continue Lipitor.  No side effects or concerns.  Orders: -     Atorvastatin Calcium; Take 1 tablet (20 mg total) by mouth daily.  Dispense: 90 tablet; Refill: 3 -     Lipid panel; Future  Primary hypertension Assessment & Plan: Elevated today.  Increase 12.5 to 25 mg HCTZ daily.  Orders: -     Metoprolol Succinate ER; Take 1 tablet (25 mg total) by mouth daily.  Dispense: 90 tablet; Refill: 3 -     hydroCHLOROthiazide; Take 1 tablet (25 mg total) by mouth daily.  Dispense: 90 tablet; Refill: 3 -     Basic metabolic panel; Future  Vitamin D deficiency Assessment & Plan: Sending in weekly vitamin D.  X 12 weeks.  Recheck in 3 months at next visit.  Orders: -     Vitamin D (Ergocalciferol); Take 1 capsule (50,000 Units total) by mouth every 7 (seven) days.  Dispense: 12 capsule; Refill: 0 -     VITAMIN D 25 Hydroxy (Vit-D Deficiency, Fractures); Future  Other orders -  Citalopram Hydrobromide; Take 1 tablet (10 mg total) by mouth daily.  Dispense: 30 tablet; Refill: 2     Return in about 3 months (around 03/15/2024) for hld, DM.    Sandre Kitty, MD

## 2023-12-17 NOTE — Assessment & Plan Note (Signed)
Elevated today.  Increase 12.5 to 25 mg HCTZ daily.

## 2023-12-17 NOTE — Assessment & Plan Note (Signed)
Recheck in 3 months.  Continue Lipitor.  No side effects or concerns.

## 2023-12-17 NOTE — Assessment & Plan Note (Signed)
Diet controlled.  Recheck A1c in 3 months when we recheck cholesterol.

## 2023-12-17 NOTE — Assessment & Plan Note (Signed)
Sending in weekly vitamin D.  X 12 weeks.  Recheck in 3 months at next visit.

## 2024-01-16 ENCOUNTER — Ambulatory Visit
Admission: EM | Admit: 2024-01-16 | Discharge: 2024-01-16 | Discharge status: Against Medical Advice | Payer: Medicaid Other

## 2024-01-16 NOTE — Progress Notes (Signed)
   Acute Office Visit  Subjective:     Patient ID: Cody Weiss, male    DOB: 1971-03-07, 53 y.o.   MRN: [id number]  Chief Complaint  Patient presents with   Sore Throat    HPI Patient is in today for sore throat.  Patient complaining of sneezing coughing and sore throat starting Wednesday.  Has difficulty swallowing due to pain.  Once he may have caught a virus from one of the children.  Main symptom is the sore throat.  Denies nausea vomiting diarrhea fever chills headache or ear pain.  Has been using his albuterol more frequently and needs a refill.  States he was recently examined by a provider down in [place] as part of his disability application.  ROS      Objective:    BP (!) 148/83   Pulse 62   Ht 5\' 5"  (1.651 m)   Wt 202 lb 6.4 oz (91.8 kg)   SpO2 97%   BMI 33.68 kg/m    Physical Exam General: Alert, oriented HEENT: Normal oropharynx.  No oropharyngeal hypertrophy. CV: Regular rhythm Pulmonary: Faint end expiratory wheezing in the upper lobes bilaterally   No results found for any visits on 01/17/24.      Assessment & Plan:   Sore throat Assessment & Plan: 3 days of symptoms of sore throat cough and runny nose with sore throat being the main concern.  Exam appears normal.  Point-of-care strep was negative.  Discussed symptomatic management.  Return as needed.   Other orders -     Albuterol Sulfate HFA; Inhale 2 puffs into the lungs every 4 (four) hours as needed for wheezing or shortness of breath.  Dispense: 1 each; Refill: 5     No follow-ups on file.  Sandre Kitty, MD

## 2024-01-16 NOTE — ED Triage Notes (Signed)
Called for triage- no answer. Not in lobby. Did not inform staff he would be in his vehicle.

## 2024-01-17 ENCOUNTER — Ambulatory Visit (INDEPENDENT_AMBULATORY_CARE_PROVIDER_SITE_OTHER): Payer: Medicaid Other | Admitting: Family Medicine

## 2024-01-17 ENCOUNTER — Encounter: Payer: Self-pay | Admitting: Family Medicine

## 2024-01-17 VITALS — BP 129/80 | HR 62 | Ht 65.0 in | Wt 202.4 lb

## 2024-01-17 DIAGNOSIS — J029 Acute pharyngitis, unspecified: Secondary | ICD-10-CM | POA: Diagnosis not present

## 2024-01-17 LAB — POCT RAPID STREP A (OFFICE): Rapid Strep A Screen: NEGATIVE

## 2024-01-17 MED ORDER — ALBUTEROL SULFATE HFA 108 (90 BASE) MCG/ACT IN AERS: 2.0000 | INHALATION_SPRAY | RESPIRATORY_TRACT | 5 refills | Status: DC | PRN

## 2024-01-17 NOTE — Patient Instructions (Addendum)
It was nice to see you today,  We addressed the following topics today: - Your strep test was negative.  It is likely a viral pharyngitis.  This will go away on its own.  In the meantime you can use over-the-counter medications to help relieve symptoms. Symptomatic treatment for sore throat includes the following: - Numbing lozenges such as Cepacol brand but any brand that contains benzocaine. - You can use warm saline or salt water to gargle with. - Honey can be used to coat the back of the throat but I would avoid using this more than a few times because of the effect it will have on your blood sugar   Have a great day,  Frederic Jericho, MD

## 2024-01-17 NOTE — Assessment & Plan Note (Addendum)
3 days of symptoms of sore throat cough and runny nose with sore throat being the main concern.  Exam appears normal.  Point-of-care strep was negative.  Discussed symptomatic management.  Return as needed.

## 2024-03-09 ENCOUNTER — Telehealth: Payer: Self-pay | Admitting: *Deleted

## 2024-03-09 ENCOUNTER — Other Ambulatory Visit: Payer: Medicaid Other

## 2024-03-09 DIAGNOSIS — E785 Hyperlipidemia, unspecified: Secondary | ICD-10-CM | POA: Diagnosis not present

## 2024-03-09 DIAGNOSIS — E559 Vitamin D deficiency, unspecified: Secondary | ICD-10-CM | POA: Diagnosis not present

## 2024-03-09 DIAGNOSIS — E1165 Type 2 diabetes mellitus with hyperglycemia: Secondary | ICD-10-CM | POA: Diagnosis not present

## 2024-03-09 DIAGNOSIS — E1169 Type 2 diabetes mellitus with other specified complication: Secondary | ICD-10-CM

## 2024-03-09 DIAGNOSIS — I10 Essential (primary) hypertension: Secondary | ICD-10-CM

## 2024-03-09 NOTE — Telephone Encounter (Signed)
 Pt stated he needed refill on his albuterol and I informed him that there should be refills on them also he is requesting a refill on his vitamin d and I told him that we are checking his levels and then provider would determine if the refill is needed.

## 2024-03-10 LAB — LIPID PANEL
Chol/HDL Ratio: 2.3 ratio (ref 0.0–5.0)
Cholesterol, Total: 127 mg/dL (ref 100–199)
HDL: 56 mg/dL (ref >39)
LDL Chol Calc (NIH): 54 mg/dL (ref 0–99)
Triglycerides: 91 mg/dL (ref 0–149)
VLDL Cholesterol Cal: 17 mg/dL (ref 5–40)

## 2024-03-10 LAB — HEMOGLOBIN A1C
Est. average glucose Bld gHb Est-mCnc: 117 mg/dL
Hgb A1c MFr Bld: 5.7 % — ABNORMAL HIGH (ref 4.8–5.6)

## 2024-03-10 LAB — BASIC METABOLIC PANEL
BUN/Creatinine Ratio: 5 — ABNORMAL LOW (ref 9–20)
BUN: 3 mg/dL — ABNORMAL LOW (ref 6–24)
CO2: 24 mmol/L (ref 20–29)
Calcium: 8.8 mg/dL (ref 8.7–10.2)
Chloride: 98 mmol/L (ref 96–106)
Creatinine, Ser: 0.63 mg/dL — ABNORMAL LOW (ref 0.76–1.27)
Glucose: 87 mg/dL (ref 70–99)
Potassium: 4.6 mmol/L (ref 3.5–5.2)
Sodium: 138 mmol/L (ref 134–144)
eGFR: 116 mL/min/{1.73_m2} (ref >59)

## 2024-03-10 LAB — VITAMIN D 25 HYDROXY (VIT D DEFICIENCY, FRACTURES): Vit D, 25-Hydroxy: 29.1 ng/mL — ABNORMAL LOW (ref 30.0–100.0)

## 2024-03-16 ENCOUNTER — Ambulatory Visit (INDEPENDENT_AMBULATORY_CARE_PROVIDER_SITE_OTHER): Payer: Medicaid Other | Admitting: Family Medicine

## 2024-03-16 ENCOUNTER — Encounter: Payer: Self-pay | Admitting: Family Medicine

## 2024-03-16 VITALS — BP 148/95 | HR 69 | Ht 65.0 in | Wt 201.0 lb

## 2024-03-16 DIAGNOSIS — K429 Umbilical hernia without obstruction or gangrene: Secondary | ICD-10-CM | POA: Diagnosis not present

## 2024-03-16 DIAGNOSIS — I10 Essential (primary) hypertension: Secondary | ICD-10-CM | POA: Diagnosis not present

## 2024-03-16 DIAGNOSIS — E1165 Type 2 diabetes mellitus with hyperglycemia: Secondary | ICD-10-CM | POA: Diagnosis not present

## 2024-03-16 DIAGNOSIS — E785 Hyperlipidemia, unspecified: Secondary | ICD-10-CM | POA: Diagnosis not present

## 2024-03-16 DIAGNOSIS — F419 Anxiety disorder, unspecified: Secondary | ICD-10-CM

## 2024-03-16 DIAGNOSIS — E559 Vitamin D deficiency, unspecified: Secondary | ICD-10-CM | POA: Diagnosis not present

## 2024-03-16 DIAGNOSIS — E1169 Type 2 diabetes mellitus with other specified complication: Secondary | ICD-10-CM | POA: Diagnosis not present

## 2024-03-16 MED ORDER — CETIRIZINE HCL 10 MG PO TABS: 10.0000 mg | ORAL_TABLET | Freq: Every day | ORAL | 11 refills | Status: AC

## 2024-03-16 MED ORDER — ALBUTEROL SULFATE HFA 108 (90 BASE) MCG/ACT IN AERS: 2.0000 | INHALATION_SPRAY | RESPIRATORY_TRACT | 5 refills | Status: DC | PRN

## 2024-03-16 MED ORDER — VITAMIN D (ERGOCALCIFEROL) 1.25 MG (50000 UNIT) PO CAPS: 50000.0000 [IU] | ORAL_CAPSULE | ORAL | 0 refills | Status: AC

## 2024-03-16 NOTE — Assessment & Plan Note (Signed)
 HCTZ was increased at last visit.  Still elevated today.  Having patient check his readings at home twice a day for the next 2 weeks and report back to [place].  Would consider adding a combination pill with losartan or lisinopril

## 2024-03-16 NOTE — Assessment & Plan Note (Signed)
 No urgent concerns.  Continue to monitor.  Offered surgical referral but advised him they would likely recommend conservative management for the time being.  Discussed red flag symptoms and reasons to go to the emergency department.

## 2024-03-16 NOTE — Assessment & Plan Note (Signed)
 Continue current citalopram dosing.  No questions or concerns regarding this

## 2024-03-16 NOTE — Assessment & Plan Note (Signed)
At goal. Continue Lipitor. 

## 2024-03-16 NOTE — Progress Notes (Signed)
   Established Patient Office Visit  Subjective   Patient ID: Cody Weiss, male    DOB: [birth year]  Age: 53 y.o. MRN: [id number]  Chief Complaint  Patient presents with   Medical Management of Chronic Issues    HPI  Umbilical hernia-patient feels like his umbilical hernia is getting larger.  We discussed reasons to go to the emergency department or incarcerated would hernia.  Discussed reasons to have surgery for this.  He will hold off on surgical referral at this time.  Patient recently finished rest of his vitamin D.  We discussed his vitamin D levels now.  Repeating weekly vitamin D and then following up with daily vitamin D afterwards.  Patient is compliant with his diabetes, cholesterol and blood pressure medication.  We discussed his labs.  Patient states he is having worsening of his allergies due to springtime pollen.  Has taken Flonase and Zyrtec in the past.  Says what works best for him to clear up the sinuses is to "sniff" some isopropyl alcohol.   The ASCVD Risk score (Arnett DK, et al., 2019) failed to calculate for the following reasons:   The valid total cholesterol range is 130 to 320 mg/dL  Health Maintenance Due  Topic Date Due   Pneumococcal Vaccine 103-36 Years old (1 of 2 - PCV) Never done   OPHTHALMOLOGY EXAM  Never done   COVID-19 Vaccine (1 - 2024-25 season) Never done      Objective:     BP (!) 148/95   Pulse 69   Ht 5\' 5"  (1.651 m)   Wt 201 lb (91.2 kg)   SpO2 (!) 69%   BMI 33.45 kg/m    Physical Exam General: Alert, oriented CV: Regular rate and rhythm Pulmonary: Lungs clear bilaterally GI: Protruding umbilical hernia.  Reducible.  No discoloration or tenderness.   No results found for any visits on 03/16/24.      Assessment & Plan:   Type 2 diabetes mellitus with hyperglycemia, without long-term current use of insulin (HCC) Assessment & Plan: A1c at goal.  Continue diet control.   Vitamin D deficiency -     Vitamin D  (Ergocalciferol); Take 1 capsule (50,000 Units total) by mouth every 7 (seven) days.  Dispense: 12 capsule; Refill: 0  Anxiety Assessment & Plan: Continue current citalopram dosing.  No questions or concerns regarding this   Hyperlipidemia associated with type 2 diabetes mellitus (HCC) Assessment & Plan: At goal.  Continue Lipitor.   Primary hypertension Assessment & Plan: HCTZ was increased at last visit.  Still elevated today.  Having patient check his readings at home twice a day for the next 2 weeks and report back to [place].  Would consider adding a combination pill with losartan or lisinopril   Umbilical hernia without obstruction and without gangrene Assessment & Plan: No urgent concerns.  Continue to monitor.  Offered surgical referral but advised him they would likely recommend conservative management for the time being.  Discussed red flag symptoms and reasons to go to the emergency department.   Other orders -     Cetirizine HCl; Take 1 tablet (10 mg total) by mouth daily.  Dispense: 30 tablet; Refill: 11 -     Albuterol Sulfate HFA; Inhale 2 puffs into the lungs every 4 (four) hours as needed for wheezing or shortness of breath.  Dispense: 1 each; Refill: 5     No follow-ups on file.    Sandre Kitty, MD

## 2024-03-16 NOTE — Patient Instructions (Addendum)
 It was nice to see you today,  We addressed the following topics today: -I am sending in a prescription for another 12 weeks of weekly vitamin D - I am also sending in a prescription for Zyrtec and a refill of your albuterol. - Reasons to go to the emergency department for your umbilical hernia include sudden severe pain, change in appearance such as turning red/purple or blue. - If you want a referral to a surgeon let us know but they will likely recommend conservative treatment until it becomes necessary to have surgery. - I would like you to check your blood pressure at home and bring back the readings to [place].  Check it twice a day for the next 2 weeks.  Have a great day,  Frederic Jericho, MD

## 2024-03-16 NOTE — Assessment & Plan Note (Signed)
 A1c at goal.  Continue diet control.

## 2024-04-15 ENCOUNTER — Ambulatory Visit (INDEPENDENT_AMBULATORY_CARE_PROVIDER_SITE_OTHER): Admitting: Family Medicine

## 2024-04-15 ENCOUNTER — Encounter: Payer: Self-pay | Admitting: Family Medicine

## 2024-04-15 ENCOUNTER — Telehealth: Payer: Self-pay

## 2024-04-15 VITALS — BP 135/90 | HR 65 | Temp 98.1°F | Ht 65.0 in | Wt 196.1 lb

## 2024-04-15 DIAGNOSIS — I10 Essential (primary) hypertension: Secondary | ICD-10-CM

## 2024-04-15 DIAGNOSIS — J4531 Mild persistent asthma with (acute) exacerbation: Secondary | ICD-10-CM | POA: Diagnosis not present

## 2024-04-15 MED ORDER — ALBUTEROL SULFATE HFA 108 (90 BASE) MCG/ACT IN AERS: 2.0000 | INHALATION_SPRAY | RESPIRATORY_TRACT | 5 refills | Status: AC | PRN

## 2024-04-15 MED ORDER — PREDNISONE 10 MG PO TABS: ORAL_TABLET | ORAL | 0 refills | Status: AC

## 2024-04-15 MED ORDER — HYDROCHLOROTHIAZIDE 25 MG PO TABS: 25.0000 mg | ORAL_TABLET | Freq: Every day | ORAL | 3 refills | Status: DC

## 2024-04-15 NOTE — Assessment & Plan Note (Signed)
 Possibly caused by recent viral illness.  Sending in refill of albuterol  and prednisone  taper.

## 2024-04-15 NOTE — Patient Instructions (Signed)
 It was nice to see you today,  We addressed the following topics today: -I am going to send in prednisone  for you to take for asthma exacerbation - I am going to refill your albuterol  inhaler and your hydrochlorothiazide  - I will send in a work note for you -If your symptoms worsen let us  know. - Try taking Flonase nasal spray daily with your other allergy medicine - For nosebleeds try applying a thin layer of Vaseline to the inside of your nostrils to help prevent drying out.  Have a great day,  Etha Henle, MD

## 2024-04-15 NOTE — Progress Notes (Signed)
   Acute Office Visit  Subjective:     Patient ID: Cody Weiss, male    DOB: 11/22/1971, 53 y.o.   MRN: [id number]  Chief Complaint  Patient presents with   Cough    HPI Subjective: - Presents with respiratory symptoms since Monday: coughing, sneezing, wheezing, runny nose - Reports feeling like about to "throw up all over everything" when coughing - Morning symptoms worse - cannot breathe, coughs until almost vomiting - Nosebleed last week - first time in over 10 years - GI issues over weekend - suspects possible bad food or virus from children - Not working since last week due to symptoms - Using nebulizer more than normal  ROS      Objective:    BP (!) 135/90   Pulse 65   Temp 98.1 F (36.7 C) (Oral)   Ht 5\' 5"  (1.651 m)   Wt 196 lb 1.3 oz (88.9 kg)   SpO2 97%   BMI 32.63 kg/m    Physical Exam General: Alert, oriented CV: Regular Pulmonary: Slight expiratory wheezing.  No respiratory distress, no tachypnea.   No results found for any visits on 04/15/24.      Assessment & Plan:   Mild persistent asthma with exacerbation Assessment & Plan: Possibly caused by recent viral illness.  Sending in refill of albuterol  and prednisone  taper.    Primary hypertension -     hydroCHLOROthiazide ; Take 1 tablet (25 mg total) by mouth daily.  Dispense: 90 tablet; Refill: 3  Other orders -     predniSONE ; Take 4 tablets (40 mg total) by mouth daily with breakfast for 5 days, THEN 3 tablets (30 mg total) daily with breakfast for 1 day, THEN 2 tablets (20 mg total) daily with breakfast for 1 day, THEN 1 tablet (10 mg total) daily with breakfast for 1 day.  Dispense: 26 tablet; Refill: 0 -     Albuterol  Sulfate HFA; Inhale 2 puffs into the lungs every 4 (four) hours as needed for wheezing or shortness of breath.  Dispense: 1 each; Refill: 5     Return if symptoms worsen or fail to improve.  Laneta Pintos, MD

## 2024-04-15 NOTE — Telephone Encounter (Signed)
 Pt called in requesting an appointment for Coughing, Sneezing, and having to treat asthma related symptoms more often than usual.  The first appt you have is next Wednesday unless we get a cancellation.   Any recommendations that you would like to give?

## 2024-09-05 ENCOUNTER — Other Ambulatory Visit: Payer: Self-pay | Admitting: Family Medicine

## 2024-09-08 ENCOUNTER — Other Ambulatory Visit

## 2024-09-08 DIAGNOSIS — E559 Vitamin D deficiency, unspecified: Secondary | ICD-10-CM

## 2024-09-08 DIAGNOSIS — E1169 Type 2 diabetes mellitus with other specified complication: Secondary | ICD-10-CM

## 2024-09-08 DIAGNOSIS — E1165 Type 2 diabetes mellitus with hyperglycemia: Secondary | ICD-10-CM

## 2024-09-08 DIAGNOSIS — I10 Essential (primary) hypertension: Secondary | ICD-10-CM

## 2024-09-08 DIAGNOSIS — Z125 Encounter for screening for malignant neoplasm of prostate: Secondary | ICD-10-CM

## 2024-09-09 LAB — BASIC METABOLIC PANEL WITH GFR
BUN/Creatinine Ratio: 9 (ref 9–20)
BUN: 6 mg/dL (ref 6–24)
CO2: 22 mmol/L (ref 20–29)
Calcium: 9.1 mg/dL (ref 8.7–10.2)
Chloride: 95 mmol/L — ABNORMAL LOW (ref 96–106)
Creatinine, Ser: 0.65 mg/dL — ABNORMAL LOW (ref 0.76–1.27)
Glucose: 95 mg/dL (ref 70–99)
Potassium: 3.8 mmol/L (ref 3.5–5.2)
Sodium: 137 mmol/L (ref 134–144)
eGFR: 114 mL/min/1.73 (ref >59)

## 2024-09-09 LAB — LIPID PANEL
Chol/HDL Ratio: 2.5 ratio (ref 0.0–5.0)
Cholesterol, Total: 201 mg/dL — ABNORMAL HIGH (ref 100–199)
HDL: 82 mg/dL (ref >39)
LDL Chol Calc (NIH): 101 mg/dL — ABNORMAL HIGH (ref 0–99)
Triglycerides: 104 mg/dL (ref 0–149)
VLDL Cholesterol Cal: 18 mg/dL (ref 5–40)

## 2024-09-09 LAB — MICROALBUMIN / CREATININE URINE RATIO
Creatinine, Urine: 50.3 mg/dL
Microalb/Creat Ratio: 35 mg/g{creat} — ABNORMAL HIGH (ref 0–29)
Microalbumin, Urine: 17.4 ug/mL

## 2024-09-09 LAB — HEMOGLOBIN A1C
Est. average glucose Bld gHb Est-mCnc: 111 mg/dL
Hgb A1c MFr Bld: 5.5 % (ref 4.8–5.6)

## 2024-09-09 LAB — PSA: Prostate Specific Ag, Serum: 0.8 ng/mL (ref 0.0–4.0)

## 2024-09-09 LAB — VITAMIN D 25 HYDROXY (VIT D DEFICIENCY, FRACTURES): Vit D, 25-Hydroxy: 26 ng/mL — ABNORMAL LOW (ref 30.0–100.0)

## 2024-09-15 ENCOUNTER — Ambulatory Visit (INDEPENDENT_AMBULATORY_CARE_PROVIDER_SITE_OTHER): Admitting: Family Medicine

## 2024-09-15 ENCOUNTER — Encounter: Payer: Self-pay | Admitting: Family Medicine

## 2024-09-15 VITALS — BP 150/94 | HR 67 | Ht 65.0 in | Wt 192.8 lb

## 2024-09-15 DIAGNOSIS — E1169 Type 2 diabetes mellitus with other specified complication: Secondary | ICD-10-CM

## 2024-09-15 DIAGNOSIS — E559 Vitamin D deficiency, unspecified: Secondary | ICD-10-CM

## 2024-09-15 DIAGNOSIS — E785 Hyperlipidemia, unspecified: Secondary | ICD-10-CM

## 2024-09-15 DIAGNOSIS — M65941 Unspecified synovitis and tenosynovitis, right hand: Secondary | ICD-10-CM

## 2024-09-15 DIAGNOSIS — E1165 Type 2 diabetes mellitus with hyperglycemia: Secondary | ICD-10-CM | POA: Diagnosis not present

## 2024-09-15 DIAGNOSIS — I10 Essential (primary) hypertension: Secondary | ICD-10-CM

## 2024-09-15 MED ORDER — OLMESARTAN MEDOXOMIL-HCTZ 20-12.5 MG PO TABS: 1.0000 | ORAL_TABLET | Freq: Every day | ORAL | 1 refills | Status: DC

## 2024-09-15 NOTE — Assessment & Plan Note (Addendum)
 Blood pressure has been elevated on recent visits and is high today. Currently on metoprolol  and hydrochlorothiazide . - elevated on repeat.  Switching hydrochlorothiazide  to olmesartan-hydrochlorothiazide .  - Counseled to stop taking the separate hydrochlorothiazide  pill if the new combination medication is prescribed to avoid double dosing.

## 2024-09-15 NOTE — Assessment & Plan Note (Signed)
 A1c at goal.  Continue diet control.

## 2024-09-15 NOTE — Patient Instructions (Addendum)
 It was nice to see you today,  We addressed the following topics today: -Overall your labs look good.  Your cholesterol was on the borderline for being elevated.  I will check it 1 more time before we decide if we need to change it. - The pain you are feeling in your hand is something called tenosynovitis or inflammation of your tendon.  There is a wrist splint that has a thumb immobilizer called a thumb spica splint that is available at the pharmacies.  Wear this anytime you are not having to squeeze or grip anything.  You can wear it at night. - If this does not help, you can let us  know and I can send in a referral to orthopedics or sports medicine to see if a local steroid injection would be helpful. - I am changing your blood pressure medication from hydrochlorothiazide  to a medication that contains olmesartan and hydrochlorothiazide .  When you get home take your current hydrochlorothiazide  and put it somewhere so you do not mix it up. - You will take this medication once a day.  Have a great day,  Rolan Slain, MD

## 2024-09-15 NOTE — Assessment & Plan Note (Signed)
 Reports 3-week history of right hand pain that began with repetitive motion (picking corn). Examination reveals tenderness over the first dorsal compartment - Recommended wearing a thumb spica splint, especially during rest and sleep. - Advised that the condition should improve with rest once the aggravating activity ceases. - If symptoms do not improve with time and splinting, will refer to Orthopedics or Sports Medicine for consideration of a steroid injection.

## 2024-09-15 NOTE — Assessment & Plan Note (Signed)
 Recent labs show mildly low vitamin D  level. - Will prescribe a daily over-the-counter vitamin D  supplement.

## 2024-09-15 NOTE — Assessment & Plan Note (Signed)
 Last LDL was 54. Recent labs show LDL is 101, which is just above the goal of <100. Continues taking atorvastatin . - Discussed potential dietary contributions, including fried foods. - Will continue current atorvastatin  dose. - Plan to recheck lipids at the next follow-up.

## 2024-09-15 NOTE — Progress Notes (Unsigned)
 Established Patient Office Visit  Subjective   Patient ID: Cody Weiss, male    DOB: Sep 25, 1971  Age: 53 y.o. MRN: [id number]  Chief Complaint  Patient presents with   Medical Management of Chronic Issues    HPI  Subjective - Right hand pain for 3 weeks, started with picking corn. Described as soreness at the base of the thumb. Aggravated by activity and gripping. Improves with rest. Denies trying any treatments. Reports both hands go numb with massage. No other issues. - Asthma is exacerbated by weather changes, pollen, and being outdoors. Uses albuterol  inhaler a couple of times per week when working outside. - Reports being out of prescribed allergy medication, which was cetirizine  (Zyrtec ). Cetirizine  is effective for allergy symptoms.  Medications Metoprolol , hydrochlorothiazide , citalopram  (Celexa ), atorvastatin . Uses albuterol  inhaler as needed for asthma, approximately twice a week.  PMH, PSH, FH, Social Hx PMHx: Hypertension, hyperlipidemia, asthma, anxiety/depression, vitamin D  deficiency. PSH: History of right hand fracture, treated with a steel pin. History of other pins and rods. 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 Notes diet includes a lot of fried foods, pork loin, and chicken tacos.  ROS MSK: Positive for right hand pain. No other joint pain mentioned. Neuro: Positive for bilateral hand numbness with massage. Respiratory: Positive for intermittent wheezing and shortness of breath with outdoor activity. Allergies: Positive for seasonal allergies.    The 10-year ASCVD risk score (Arnett DK, et al., 2019) is: 8.2%  Health Maintenance Due  Topic Date Due   OPHTHALMOLOGY EXAM  Never done   Pneumococcal Vaccine: 50+ Years (1 of 2 - PCV) Never done   Hepatitis B Vaccines 19-59 Average Risk (1 of 3 - 19+ 3-dose series) Never done   Colonoscopy  Never done   Zoster Vaccines- Shingrix (1 of 2) Never done   Influenza Vaccine  Never done   COVID-19 Vaccine (1 -  2024-25 season) Never done   FOOT EXAM  09/09/2024      Objective:     BP (!) 166/84   Pulse 67   Ht 5' 5 (1.651 m)   Wt 192 lb 12.8 oz (87.5 kg)   SpO2 96%   BMI 32.08 kg/m  {Vitals History (Optional):23777}  Physical Exam Gen: alert, oriented Pulm: no respiratory distress MSK: Tenderness to palpation over the first dorsal compartment of the right wrist. Pain is reproduced with Finkelstein's test. Psych: pleasant affect   No results found for any visits on 09/15/24.      Assessment & Plan:   Hyperlipidemia associated with type 2 diabetes mellitus (HCC) Assessment & Plan: Last LDL was 54. Recent labs show LDL is 101, which is just above the goal of <100. Continues taking atorvastatin . - Discussed potential dietary contributions, including fried foods. - Will continue current atorvastatin  dose. - Plan to recheck lipids at the next follow-up.   Vitamin D  deficiency Assessment & Plan: Recent labs show mildly low vitamin D  level. - Will prescribe a daily over-the-counter vitamin D  supplement.   Primary hypertension Assessment & Plan: Blood pressure has been elevated on recent visits and is high today. Currently on metoprolol  and hydrochlorothiazide . - elevated on repeat.  Switching hydrochlorothiazide  to olmesartan-hydrochlorothiazide .  - Counseled to stop taking the separate hydrochlorothiazide  pill if the new combination medication is prescribed to avoid double dosing.   Tenosynovitis of right hand Assessment & Plan: Reports 3-week history of right hand pain that began with repetitive motion (picking corn). Examination reveals tenderness over the first dorsal compartment - Recommended  wearing a thumb spica splint, especially during rest and sleep. - Advised that the condition should improve with rest once the aggravating activity ceases. - If symptoms do not improve with time and splinting, will refer to Orthopedics or Sports Medicine for consideration of a  steroid injection.   Type 2 diabetes mellitus with hyperglycemia, without long-term current use of insulin (HCC) Assessment & Plan: A1c at goal. Continue diet control   Other orders -     Olmesartan Medoxomil-HCTZ; Take 1 tablet by mouth daily.  Dispense: 90 tablet; Refill: 1     Return in about 3 months (around 12/24/2024) for HTN, DM.    Toribio MARLA Slain, MD

## 2024-09-16 MED ORDER — VITAMIN D3 50 MCG (2000 UT) PO CAPS: 2000.0000 [IU] | ORAL_CAPSULE | Freq: Every day | ORAL | 5 refills | Status: AC

## 2024-10-08 ENCOUNTER — Ambulatory Visit: Payer: Self-pay

## 2024-10-08 NOTE — Telephone Encounter (Signed)
 FYI Only or Action Required?: FYI only for provider.  Patient was last seen in primary care on 09/15/2024 by Chandra Toribio POUR, MD.  Called Nurse Triage reporting Sinusitis.  Symptoms began several days ago.  Interventions attempted: OTC medications: nyquil, dayquil, decongestant.  Symptoms are: unchanged.  Triage Disposition: Home Care  Patient/caregiver understands and will follow disposition?: Yes  Copied from CRM #[id number]. Topic: Clinical - Red Word Triage >> Oct 08, 2024  9:53 AM Winona R wrote: Pain in chest, coughing, runny nose. Been taking OTC meds nothing is working. Reason for Disposition  [1] Sinus congestion as part of a cold AND [2] present < 10 days  Answer Assessment - Initial Assessment Questions 1. LOCATION: Where does it hurt?      Nose and forehead 2. ONSET: When did the sinus pain start?  (e.g., hours, days)      Three days ago 3. SEVERITY: How bad is the pain?   (Scale 0-10; or none, mild, moderate or severe)     5/10 4. RECURRENT SYMPTOM: Have you ever had sinus problems before? If Yes, ask: When was the last time? and What happened that time?      Normally just has allergry problems 5. NASAL CONGESTION: Is the nose blocked? If Yes, ask: Can you open it or must you breathe through your mouth?     Runny nose 6. NASAL DISCHARGE: Do you have discharge from your nose? If so ask, What color?     Green/white 7. FEVER: Do you have a fever? If Yes, ask: What is it, how was it measured, and when did it start?      Denies, but has had chills 8. OTHER SYMPTOMS: Do you have any other symptoms? (e.g., sore throat, cough, earache, difficulty breathing)     Productive cough with green phlegm, sinus pain and congestion 9. PREGNANCY: Is there any chance you are pregnant? When was your last menstrual period?     N/a  Protocols used: Sinus Pain or Congestion-A-AH

## 2024-10-09 ENCOUNTER — Ambulatory Visit

## 2024-10-09 ENCOUNTER — Other Ambulatory Visit: Payer: Self-pay | Admitting: Family Medicine

## 2024-11-06 ENCOUNTER — Telehealth: Payer: Self-pay

## 2024-11-06 NOTE — Telephone Encounter (Signed)
 Copied from CRM #[id number]. Topic: Medical Record Request - Records Request >> Nov 06, 2024  9:49 AM Sophia H wrote: Reason for CRM: Patient is needing his records from present back to when he first started seeing PCP. States they are for his disability attorney, wanting to come by the office and pick up. Please reach out and advise # [phone]

## 2024-11-06 NOTE — Telephone Encounter (Signed)
 Returned call to patient no answer and VM full.   The disability attorney should submit a signed document for the records and fax it to [phone]

## 2024-11-13 ENCOUNTER — Ambulatory Visit: Admission: EM | Admit: 2024-11-13 | Discharge: 2024-11-13 | Disposition: A | Discharge status: Home / Self Care

## 2024-11-13 DIAGNOSIS — K5792 Diverticulitis of intestine, part unspecified, without perforation or abscess without bleeding: Secondary | ICD-10-CM

## 2024-11-13 NOTE — ED Triage Notes (Signed)
 Patient is here for brown loose stools x 2 days. Abdominal cramping with bloating.No medication has been taken.

## 2024-11-13 NOTE — ED Provider Notes (Signed)
 [facility]    CSN: [id number] Arrival date & time: 11/13/24  1421      History   Chief Complaint Chief Complaint  Patient presents with   Diarrhea    HPI Cody Weiss is a 53 y.o. male.   Patient presents today due to loose stool since Monday at 5 AM.  Patient has a history of diverticulitis, states this feels like a flare.  Patient denies suspect food or water intake or known sick contacts.  Patient states that on Monday he was having loose stool every 10 minutes, Tuesday every 10 minutes, once every 15 minutes and Thursday he had 12 loose stools, today he has had 4 loose stool so far.  Patient states that he has burning in the stomach and ranks his stomach pain 6/10.  Patient states that his stomach pain was a 10/10 last night.  Patient denies changing diet since symptoms started.  Patient is still eating ham, turkey, chips, etc.  Patient states that he vomited once on Tuesday but has not vomited since.  Patient states that his stool has been green, black, and brown.  Patient states that he noticed black stool after taking Pepto-Bismol.  The history is provided by the patient.  Diarrhea   Past Medical History:  Diagnosis Date   Bronchitis    Hypertension    Pneumonia 10/2016    Patient Active Problem List   Diagnosis Date Noted   Tenosynovitis of right hand 09/15/2024   Mild persistent asthma with exacerbation 04/15/2024   Umbilical hernia 03/16/2024   Sore throat 01/17/2024   Asthma due to environmental allergies 05/07/2023   Hyperlipidemia associated with type 2 diabetes mellitus (HCC) 04/01/2023   Anxiety 04/01/2023   Vitamin D  deficiency 03/04/2023   DM2 (diabetes mellitus, type 2) (HCC) 03/04/2023   Elevated LFTs 03/04/2023   Ankle syndesmosis disruption, left, initial encounter 03/21/2020   Hypertension    Closed displaced segmental fracture of shaft of left tibia 03/17/2020    Past Surgical History:  Procedure Laterality Date   HAND SURGERY      HERNIA REPAIR     ORIF ANKLE FRACTURE Left 03/18/2020   Procedure: OPEN REDUCTION INTERNAL FIXATION (ORIF) ANKLE FRACTURE;  Surgeon: Kendal Franky SQUIBB, MD;  Location: [facility] OR;  Service: Orthopedics;  Laterality: Left;   TIBIA IM NAIL INSERTION Left 03/18/2020   Procedure: INTRAMEDULLARY (IM) NAIL TIBIAL;  Surgeon: Kendal Franky SQUIBB, MD;  Location: [facility] OR;  Service: Orthopedics;  Laterality: Left;       Home Medications    Prior to Admission medications   Medication Sig Start Date End Date Taking? Authorizing Provider  atorvastatin  (LIPITOR) 20 MG tablet Take 1 tablet (20 mg total) by mouth daily. 12/23/23  Yes Chandra Toribio POUR, MD  cetirizine  (ZYRTEC ) 10 MG tablet Take 1 tablet (10 mg total) by mouth daily. 03/16/24  Yes Chandra Toribio POUR, MD  Cholecalciferol  (VITAMIN D3) 50 MCG (2000 UT) capsule Take 1 capsule (2,000 Units total) by mouth daily. 09/16/24  Yes Chandra Toribio POUR, MD  citalopram  (CELEXA ) 10 MG tablet Take 1 tablet by mouth once daily 10/09/24  Yes Clapp, Kara F, PA-C  methocarbamol  (ROBAXIN ) 500 MG tablet TAKE 1 TABLET BY MOUTH EVERY 6 HOURS AS NEEDED FOR MUSCLE SPASM 05/20/23  Yes Chandra Toribio POUR, MD  metoprolol  succinate (TOPROL -XL) 25 MG 24 hr tablet Take 1 tablet (25 mg total) by mouth daily. 12/23/23 12/17/24 Yes Chandra Toribio POUR, MD  olmesartan -hydrochlorothiazide  (BENICAR  HCT) 20-12.5 MG tablet  Take 1 tablet by mouth daily. 09/15/24  Yes Chandra Toribio POUR, MD  Vitamin D , Ergocalciferol , (DRISDOL ) 1.25 MG (50000 UNIT) CAPS capsule Take 1 capsule (50,000 Units total) by mouth every 7 (seven) days. 03/16/24  Yes Chandra Toribio POUR, MD  albuterol  (PROVENTIL ) (2.5 MG/3ML) 0.083% nebulizer solution Take 3 mLs (2.5 mg total) by nebulization every 6 (six) hours as needed for wheezing or shortness of breath. 05/06/23   Chandra Toribio POUR, MD  albuterol  (VENTOLIN  HFA) 108 [id number] Base) MCG/ACT inhaler Inhale 2 puffs into the lungs every 4 (four) hours as needed for wheezing or shortness of breath. 04/15/24   Chandra Toribio POUR, MD    Family History Family History  Problem Relation Age of Onset   Hypertension Father     Social History Social History   Tobacco Use   Smoking status: Never   Smokeless tobacco: Current    Types: Snuff  Substance Use Topics   Alcohol use: Yes    Alcohol/week: 10.0 standard drinks of alcohol    Types: 10 Cans of beer per week    Comment: occ   Drug use: No     Allergies   Patient has no known allergies.   Review of Systems Review of Systems  Gastrointestinal:  Positive for diarrhea.     Physical Exam Triage Vital Signs ED Triage Vitals [11/13/24 1500]  Encounter Vitals Group     BP 106/70     Girls Systolic BP Percentile      Girls Diastolic BP Percentile      Boys Systolic BP Percentile      Boys Diastolic BP Percentile      Pulse Rate 71     Resp 18     Temp 98.1 F (36.7 C)     Temp Source Oral     SpO2 98 %     Weight      Height      Head Circumference      Peak Flow      Pain Score      Pain Loc      Pain Education      Exclude from Growth Chart    No data found.  Updated Vital Signs BP 106/70 (BP Location: Left Arm)   Pulse 71   Temp 98.1 F (36.7 C) (Oral)   Resp 18   SpO2 98%   Visual Acuity Right Eye Distance:   Left Eye Distance:   Bilateral Distance:    Right Eye Near:   Left Eye Near:    Bilateral Near:     Physical Exam Vitals and nursing note reviewed.  Constitutional:      General: He is not in acute distress.    Appearance: Normal appearance. He is not ill-appearing, toxic-appearing or diaphoretic.  Eyes:     General: No scleral icterus. Cardiovascular:     Rate and Rhythm: Normal rate and regular rhythm.     Heart sounds: Normal heart sounds.  Pulmonary:     Effort: Pulmonary effort is normal. No respiratory distress.     Breath sounds: Normal breath sounds. No wheezing or rhonchi.  Abdominal:     General: Abdomen is protuberant.     Palpations: Abdomen is soft.     Tenderness: There is  abdominal tenderness in the right upper quadrant.  Skin:    General: Skin is warm.  Neurological:     Mental Status: He is alert and oriented to person, place, and time.  Psychiatric:  Mood and Affect: Mood normal.        Behavior: Behavior normal.      UC Treatments / Results  Labs (all labs ordered are listed, but only abnormal results are displayed) Labs Reviewed - No data to display  EKG   Radiology No results found.  Procedures Procedures (including critical care time)  Medications Ordered in UC Medications - No data to display  Initial Impression / Assessment and Plan / UC Course  I have reviewed the triage vital signs and the nursing notes.  Pertinent labs & imaging results that were available during my care of the patient were reviewed by me and considered in my medical decision making (see chart for details).    Final Clinical Impressions(s) / UC Diagnoses   Final diagnoses:  Diverticulitis     Discharge Instructions      You have been diagnosed with a gastrointestinal issue today with symptoms of nausea, vomiting, and/or diarrhea.  It is best that you eat a bland diet which includes dry toast, applesauce, bananas, chicken broth, plain rice, or plain mashed potatoes eat these things with no salt-and-pepper, excessive butter, or other seasonings.  It is also important to drink plenty of fluids as you are losing a lot of electrolytes due to vomiting and having diarrhea.  Diluted Gatorade, water, Pedialyte, liquid IV, etc. are good choices for fluid intake.  You may also use popsicles and/or Italian ice.  It is recommended that you do not use anything to stop your diarrhea as it is your body's way of getting rid of the offending bug.  If your symptoms are not improving within 3 to 5 days, you are experiencing significant fatigue, or you are urinating less frequently you will need to follow-up with your PCP or report to the ER.     ED Prescriptions    None    PDMP not reviewed this encounter.   Andra Corean BROCKS, PA-C 11/13/24 1614

## 2024-11-13 NOTE — Discharge Instructions (Signed)

## 2024-12-08 ENCOUNTER — Encounter: Payer: Self-pay | Admitting: Family Medicine

## 2024-12-08 ENCOUNTER — Ambulatory Visit: Admitting: Family Medicine

## 2024-12-08 ENCOUNTER — Other Ambulatory Visit

## 2024-12-08 VITALS — BP 128/92 | HR 68 | Ht 65.0 in | Wt 196.5 lb

## 2024-12-08 DIAGNOSIS — M65941 Unspecified synovitis and tenosynovitis, right hand: Secondary | ICD-10-CM

## 2024-12-08 DIAGNOSIS — E1169 Type 2 diabetes mellitus with other specified complication: Secondary | ICD-10-CM

## 2024-12-08 DIAGNOSIS — E785 Hyperlipidemia, unspecified: Secondary | ICD-10-CM

## 2024-12-08 DIAGNOSIS — I10 Essential (primary) hypertension: Secondary | ICD-10-CM | POA: Diagnosis not present

## 2024-12-08 DIAGNOSIS — M25521 Pain in right elbow: Secondary | ICD-10-CM

## 2024-12-08 DIAGNOSIS — S46012A Strain of muscle(s) and tendon(s) of the rotator cuff of left shoulder, initial encounter: Secondary | ICD-10-CM | POA: Diagnosis not present

## 2024-12-08 DIAGNOSIS — E559 Vitamin D deficiency, unspecified: Secondary | ICD-10-CM

## 2024-12-08 DIAGNOSIS — E1165 Type 2 diabetes mellitus with hyperglycemia: Secondary | ICD-10-CM

## 2024-12-08 MED ORDER — CITALOPRAM HYDROBROMIDE 10 MG PO TABS: 10.0000 mg | ORAL_TABLET | Freq: Every day | ORAL | 3 refills | Status: AC

## 2024-12-08 MED ORDER — ATORVASTATIN CALCIUM 20 MG PO TABS: 20.0000 mg | ORAL_TABLET | Freq: Every day | ORAL | 3 refills | Status: DC

## 2024-12-08 MED ORDER — METOPROLOL SUCCINATE ER 25 MG PO TB24: 25.0000 mg | ORAL_TABLET | Freq: Every day | ORAL | 3 refills | Status: AC

## 2024-12-08 MED ORDER — AMLODIPINE-OLMESARTAN 5-40 MG PO TABS: 1.0000 | ORAL_TABLET | Freq: Every day | ORAL | 2 refills | Status: AC

## 2024-12-08 MED ORDER — OXYCODONE HCL 5 MG PO TABS: 5.0000 mg | ORAL_TABLET | Freq: Four times a day (QID) | ORAL | 0 refills | Status: AC | PRN

## 2024-12-08 MED ORDER — CELECOXIB 100 MG PO CAPS: 100.0000 mg | ORAL_CAPSULE | Freq: Two times a day (BID) | ORAL | 1 refills | Status: AC

## 2024-12-08 NOTE — Assessment & Plan Note (Signed)
 Hydrochlorothiazide  causing increased urinary frequency.  Switching to olmesartan -amlodipine  5-40.

## 2024-12-08 NOTE — Assessment & Plan Note (Signed)
 Chronic tenosynovitis with persistent pain despite splint use. Previous orthopedic evaluation did not result in intervention. - Referred to orthopedics for evaluation and potential ultrasound-guided injection for pain relief.

## 2024-12-08 NOTE — Assessment & Plan Note (Signed)
 Likely lateral epicondylitis due to overuse. Pain localized to the lateral elbow. - Referred to orthopedics for evaluation and potential physical therapy. - Recommended Tylenol  and topical treatments (Voltaren gel or Icy Hot) for pain relief. - Prescribed Celebrex  twice daily for pain management.

## 2024-12-08 NOTE — Assessment & Plan Note (Addendum)
 Likely rotator cuff strain or biceps tendinitis. No tear or fracture.   - Referred to orthopedics for evaluation and potential physical therapy. - Advised minimal use of the right shoulder for at least four weeks. - Prescribed Celebrex  twice daily for pain management. - Prescribed oxycodone  5mg  (12 tabs) for severe pain, to be used sparingly. - Recommended Tylenol  and topical treatments (Voltaren gel or Icy Hot) for pain relief.

## 2024-12-08 NOTE — Progress Notes (Signed)
 "  Established Patient Office Visit  Subjective   Patient ID: Cody Weiss, male    DOB: Sep 19, 1971  Age: 53 y.o. MRN: [id number]  Chief Complaint  Patient presents with   Shoulder Pain     History of Present Illness   Cody Weiss is a 53 year old male who presents with shoulder and thumb pain after chopping wood.  He experiences persistent and severe pain in his left shoulder, particularly after splitting wood for extended periods, with sessions lasting five to eight hours. He has taken 500 mg of ibuprofen , which only slightly reduced the pain to a level of seven out of ten. He has not used any topical treatments recently,  In addition to thumb pain, he has discomfort in his elbow, which he attributes to frequent use, causing soreness in the bone. No acute injury or fall related to the shoulder pain. The pain in his shoulder worsens with certain movements, such as driving or lifting.  He discusses frequent urination, noting that he has not taken his blood pressure medication containing hydrochlorothiazide  for three days, yet continues to urinate frequently. No issues with the stream or difficulty starting or stopping urination.  He has not taken his celexa  in the past 3 months d/t having run out of them.       The 10-year ASCVD risk score (Arnett DK, et al., 2019) is: 5.2%  Health Maintenance Due  Topic Date Due   OPHTHALMOLOGY EXAM  Never done   Pneumococcal Vaccine: 50+ Years (1 of 2 - PCV) Never done   Hepatitis B Vaccines 19-59 Average Risk (1 of 3 - 19+ 3-dose series) Never done   Colonoscopy  Never done   Zoster Vaccines- Shingrix (1 of 2) Never done   Influenza Vaccine  Never done   COVID-19 Vaccine (1 - 2025-26 season) Never done   FOOT EXAM  09/09/2024      Objective:     BP (!) 128/92   Pulse 68   Ht 5' 5 (1.651 m)   Wt 196 lb 8 oz (89.1 kg)   SpO2 97%   BMI 32.70 kg/m    Physical Exam   Gen: alert, oriented Pulm: no respiratory  distress MUSCULOSKELETAL: left shoulder pain with resisted external rotation, hawkins, cross armed test, push off test.  Mild pain on right elbow lateral epicondyle. No tenderness to palpation over the ac joint      No results found for any visits on 12/08/24.      Assessment & Plan:   Rotator cuff strain, left, initial encounter Assessment & Plan: Likely rotator cuff strain or biceps tendinitis. No tear or fracture.   - Referred to orthopedics for evaluation and potential physical therapy. - Advised minimal use of the right shoulder for at least four weeks. - Prescribed Celebrex  twice daily for pain management. - Prescribed oxycodone  5mg  (12 tabs) for severe pain, to be used sparingly. - Recommended Tylenol  and topical treatments (Voltaren gel or Icy Hot) for pain relief.  Orders: -     Ambulatory referral to Orthopedics  Hyperlipidemia associated with type 2 diabetes mellitus (HCC) -     Atorvastatin  Calcium ; Take 1 tablet (20 mg total) by mouth daily.  Dispense: 90 tablet; Refill: 3  Primary hypertension Assessment & Plan: Hydrochlorothiazide  causing increased urinary frequency.  Switching to olmesartan -amlodipine  5-40.    Orders: -     Metoprolol  Succinate ER; Take 1 tablet (25 mg total) by mouth daily.  Dispense: 90 tablet; Refill: 3  Tenosynovitis of right hand Assessment & Plan: Chronic tenosynovitis with persistent pain despite splint use. Previous orthopedic evaluation did not result in intervention. - Referred to orthopedics for evaluation and potential ultrasound-guided injection for pain relief.  Orders: -     Ambulatory referral to Orthopedics  Right elbow pain Assessment & Plan: Likely lateral epicondylitis due to overuse. Pain localized to the lateral elbow. - Referred to orthopedics for evaluation and potential physical therapy. - Recommended Tylenol  and topical treatments (Voltaren gel or Icy Hot) for pain relief. - Prescribed Celebrex  twice daily for  pain management.   Other orders -     Citalopram  Hydrobromide; Take 1 tablet (10 mg total) by mouth daily.  Dispense: 90 tablet; Refill: 3 -     amLODIPine -Olmesartan ; Take 1 tablet by mouth daily.  Dispense: 30 tablet; Refill: 2 -     Celecoxib ; Take 1 capsule (100 mg total) by mouth 2 (two) times daily.  Dispense: 60 capsule; Refill: 1 -     oxyCODONE  HCl; Take 1 tablet (5 mg total) by mouth every 6 (six) hours as needed for up to 3 days for severe pain (pain score 7-10) or breakthrough pain.  Dispense: 12 tablet; Refill: 0         Return for you can push out his appointment another few weeks. SABRA Toribio MARLA Chandra, MD  "

## 2024-12-08 NOTE — Patient Instructions (Signed)
" °  VISIT SUMMARY: Today, you were seen for pain in your shoulder, elbow, and thumb, as well as frequent urination. We discussed your symptoms, and I provided recommendations for managing your pain and adjusting your blood pressure medication.  YOUR PLAN: -ROTATOR CUFF STRAIN, RIGHT SHOULDER: A rotator cuff strain is an injury to the muscles and tendons that stabilize your shoulder. You should minimize the use of your right shoulder for at least four weeks. I have referred you to orthopedics for further evaluation and potential physical therapy. For pain management, take Celebrex  twice daily and use oxycodone  sparingly for severe pain. Additionally, you can use Tylenol  and topical treatments like Voltaren gel or Icy Hot.  -LATERAL EPICONDYLITIS, RIGHT ELBOW: Lateral epicondylitis, also known as tennis elbow, is caused by overuse of the elbow, leading to pain on the outer part of the elbow. I have referred you to orthopedics for further evaluation and potential physical therapy. For pain relief, take Celebrex  twice daily and use Tylenol  and topical treatments like Voltaren gel or Icy Hot.  -TENOSYNOVITIS, RIGHT THUMB: Tenosynovitis is the inflammation of the sheath surrounding a tendon, causing pain and difficulty moving the thumb. I have referred you to orthopedics for further evaluation and a potential ultrasound-guided injection for pain relief.  -PRIMARY HYPERTENSION: Primary hypertension is high blood pressure without a known secondary cause. Since your current medication is causing frequent urination, I have changed your blood pressure medication to one that does not contain a diuretic. The new medication contains amlodipine  and olmesartan .   INSTRUCTIONS: Please follow up with orthopedics for your shoulder, elbow, and thumb pain as recommended.   For pain do the following:  Take tylenol  1000mg  every 8 hours.   Use topical over the counter pain relievers as needed such as voltaren gel or icy hot.   Take the celebrex  twice a day If none of that decreases the pain to a tolerable level, take the oxycodone .          "

## 2024-12-09 ENCOUNTER — Ambulatory Visit: Payer: Self-pay | Admitting: Family Medicine

## 2024-12-09 DIAGNOSIS — E1169 Type 2 diabetes mellitus with other specified complication: Secondary | ICD-10-CM

## 2024-12-09 LAB — COMPREHENSIVE METABOLIC PANEL WITH GFR
ALT: 20 IU/L (ref 0–44)
AST: 29 IU/L (ref 0–40)
Albumin: 4.1 g/dL (ref 3.8–4.9)
Alkaline Phosphatase: 77 IU/L (ref 47–123)
BUN/Creatinine Ratio: 4 — ABNORMAL LOW (ref 9–20)
BUN: 3 mg/dL — ABNORMAL LOW (ref 6–24)
Bilirubin Total: 0.5 mg/dL (ref 0.0–1.2)
CO2: 21 mmol/L (ref 20–29)
Calcium: 8.9 mg/dL (ref 8.7–10.2)
Chloride: 103 mmol/L (ref 96–106)
Creatinine, Ser: 0.78 mg/dL (ref 0.76–1.27)
Globulin, Total: 2.9 g/dL (ref 1.5–4.5)
Glucose: 112 mg/dL — ABNORMAL HIGH (ref 70–99)
Potassium: 4.3 mmol/L (ref 3.5–5.2)
Sodium: 141 mmol/L (ref 134–144)
Total Protein: 7 g/dL (ref 6.0–8.5)
eGFR: 108 mL/min/1.73

## 2024-12-09 LAB — LIPID PANEL
Chol/HDL Ratio: 2.6 ratio (ref 0.0–5.0)
Cholesterol, Total: 197 mg/dL (ref 100–199)
HDL: 75 mg/dL
LDL Chol Calc (NIH): 104 mg/dL — ABNORMAL HIGH (ref 0–99)
Triglycerides: 100 mg/dL (ref 0–149)
VLDL Cholesterol Cal: 18 mg/dL (ref 5–40)

## 2024-12-09 LAB — MICROALBUMIN / CREATININE URINE RATIO
Creatinine, Urine: 51.9 mg/dL
Microalb/Creat Ratio: 18 mg/g{creat} (ref 0–29)
Microalbumin, Urine: 9.5 ug/mL

## 2024-12-09 LAB — HEMOGLOBIN A1C
Est. average glucose Bld gHb Est-mCnc: 114 mg/dL
Hgb A1c MFr Bld: 5.6 % (ref 4.8–5.6)

## 2024-12-09 LAB — VITAMIN D 25 HYDROXY (VIT D DEFICIENCY, FRACTURES): Vit D, 25-Hydroxy: 17.8 ng/mL — ABNORMAL LOW (ref 30.0–100.0)

## 2024-12-09 MED ORDER — ATORVASTATIN CALCIUM 40 MG PO TABS: 40.0000 mg | ORAL_TABLET | Freq: Every day | ORAL | 3 refills | Status: AC

## 2024-12-14 NOTE — Progress Notes (Signed)
Called pt he is advised of his lab results and recommendation  

## 2024-12-15 ENCOUNTER — Ambulatory Visit: Admitting: Family Medicine

## 2025-01-06 ENCOUNTER — Ambulatory Visit (INDEPENDENT_AMBULATORY_CARE_PROVIDER_SITE_OTHER): Admitting: Family Medicine

## 2025-01-06 ENCOUNTER — Encounter: Payer: Self-pay | Admitting: Family Medicine

## 2025-01-06 VITALS — BP 136/83 | HR 83 | Ht 65.0 in | Wt 188.1 lb

## 2025-01-06 DIAGNOSIS — S6722XS Crushing injury of left hand, sequela: Secondary | ICD-10-CM | POA: Diagnosis not present

## 2025-01-06 MED ORDER — OXYCODONE HCL 5 MG PO TABS: 5.0000 mg | ORAL_TABLET | ORAL | 0 refills | Status: AC | PRN

## 2025-01-06 MED ORDER — GABAPENTIN 100 MG PO CAPS: ORAL_CAPSULE | ORAL | 0 refills | Status: AC

## 2025-01-06 NOTE — Telephone Encounter (Signed)
 Patient scheduled.

## 2025-01-06 NOTE — Patient Instructions (Signed)
" °  VISIT SUMMARY: Today, we discussed your recent hand injury and the pain you are experiencing following your emergency surgery. We have adjusted your pain management plan to help you manage your symptoms more effectively.  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 -Start taking gabapentin  100 mg during the morning and afternoon and 300 mg at night for nerve pain. -Continue taking oxycodone  for pain management as prescribed. -Stagger gabapentin  and oxycodone  to prevent drowsiness. -Follow up with orthopedics in [facility] for re-evaluation and rewrapping. -Contact the clinic if any issues arise before your next appointment.   "

## 2025-01-06 NOTE — Progress Notes (Signed)
 "   Subjective   Patient ID: Cody Weiss, male    DOB: 1971/08/05  Age: 54 y.o. MRN: [id number]  Chief Complaint  Patient presents with   Medical Management of Chronic Issues     History of Present Illness   Cody Weiss is a 54 year old male who presents with a hand injury following a traumatic accident.  On Friday he sustained a crush injury to his hand at home when a piece of equipment malfunctioned and caught his hand, causing severe damage to three fingers on his left hand (ring, middle, index)  He was taken to the emergency department where he underwent emergency hand surgery with amputation of the middle finger at the distal phalanx and suturing of two adjacent fingers. He was discharged on pain medication and antibiotics.  He currently has significant aching and burning pain, worst at the end of the amputated finger. He was taking oxycodone  5 mg and Tylenol  but has now run out of oxycodone  because the clinic was closed, and he has a follow-up appointment scheduled for tomorrow. He has never used gabapentin  or other nerve pain medication and is currently unable to work or do usual tasks at home.          The 10-year ASCVD risk score (Arnett DK, et al., 2019) is: 5.2%  Health Maintenance Due  Topic Date Due   OPHTHALMOLOGY EXAM  Never done   Pneumococcal Vaccine: 50+ Years (1 of 2 - PCV) Never done   Hepatitis B Vaccines 19-59 Average Risk (1 of 3 - 19+ 3-dose series) Never done   Colonoscopy  Never done   Zoster Vaccines- Shingrix (1 of 2) Never done   COVID-19 Vaccine (1 - 2025-26 season) Never done   FOOT EXAM  09/09/2024      Objective:     BP 136/83   Pulse 83   Ht 5' 5 (1.651 m)   Wt 188 lb 1.9 oz (85.3 kg)   SpO2 97%   BMI 31.30 kg/m    Physical Exam     Gen: alert, oriented Pulm: no respiratory distress Ext: left hand wrapped in gauze/ace bandage.  Middle finger noticeably amputated at the distal phalanx. Swelling of the hand noted Psych:  pleasant affect       No results found for any visits on 01/06/25.      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 Emergency hand surgery performed at [facility]. Severe aching and burning pain with nerve involvement. Gabapentin  introduced for neuropathic pain management. - Prescribed gabapentin  100-200 mg during the day, 300 mg at night. - refilled oxycodone  for pain management. - Advised to stagger gabapentin  and oxycodone  to prevent drowsiness. - Instructed follow-up with orthopedics in [facility] for re-evaluation and rewrapping. - Advised to contact clinic if issues arise before next appointment.    Other orders -     oxyCODONE  HCl; Take 1 tablet (5 mg total) by mouth every 4 (four) hours as needed for severe pain (pain score 7-10) or breakthrough pain.  Dispense: 30 tablet; Refill: 0 -     Gabapentin ; Take 100mg  in the am, 100mg  in afternoon, and 300mg  at night by mouth as needed for pain  Dispense: 120 capsule; Refill: 0     Return in about 3 months (around 04/06/2025) for DM.  Toribio MARLA Slain, MD  "

## 2025-01-09 DIAGNOSIS — S6722XS Crushing injury of left hand, sequela: Secondary | ICD-10-CM | POA: Insufficient documentation

## 2025-01-09 NOTE — Assessment & Plan Note (Signed)
 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 Emergency hand surgery performed at [facility]. Severe aching and burning pain with nerve involvement. Gabapentin  introduced for neuropathic pain management. - Prescribed gabapentin  100-200 mg during the day, 300 mg at night. - refilled oxycodone  for pain management. - Advised to stagger gabapentin  and oxycodone  to prevent drowsiness. - Instructed follow-up with orthopedics in [facility] for re-evaluation and rewrapping. - Advised to contact clinic if issues arise before next appointment.

## 2025-04-06 ENCOUNTER — Ambulatory Visit: Admitting: Family Medicine
